# Patient Record
Sex: Female | Born: 1975 | Hispanic: Yes | Marital: Married | Smoking: Never smoker
Health system: Southern US, Community
[De-identification: ages and names within clinical notes are randomized; demographics above are authoritative.]

## PROBLEM LIST (undated history)

## (undated) ENCOUNTER — Inpatient Hospital Stay (HOSPITAL_COMMUNITY): Payer: Self-pay

## (undated) DIAGNOSIS — O24419 Gestational diabetes mellitus in pregnancy, unspecified control: Secondary | ICD-10-CM

## (undated) DIAGNOSIS — I358 Other nonrheumatic aortic valve disorders: Secondary | ICD-10-CM

## (undated) DIAGNOSIS — R748 Abnormal levels of other serum enzymes: Secondary | ICD-10-CM

## (undated) HISTORY — DX: Abnormal levels of other serum enzymes: R74.8

## (undated) HISTORY — PX: CHOLECYSTECTOMY: SHX55

## (undated) HISTORY — DX: Other nonrheumatic aortic valve disorders: I35.8

## (undated) HISTORY — DX: Gestational diabetes mellitus in pregnancy, unspecified control: O24.419

---

## 1997-10-22 ENCOUNTER — Inpatient Hospital Stay (HOSPITAL_COMMUNITY): Admission: AD | Admit: 1997-10-22 | Discharge: 1997-10-22 | Payer: Self-pay | Admitting: Obstetrics & Gynecology

## 1998-03-28 ENCOUNTER — Inpatient Hospital Stay (HOSPITAL_COMMUNITY): Admission: AD | Admit: 1998-03-28 | Discharge: 1998-03-28 | Payer: Self-pay | Admitting: Obstetrics

## 1998-03-29 ENCOUNTER — Emergency Department (HOSPITAL_COMMUNITY): Admission: EM | Admit: 1998-03-29 | Discharge: 1998-03-29 | Payer: Self-pay | Admitting: Emergency Medicine

## 1999-09-11 ENCOUNTER — Emergency Department (HOSPITAL_COMMUNITY): Admission: EM | Admit: 1999-09-11 | Discharge: 1999-09-11 | Payer: Self-pay | Admitting: Emergency Medicine

## 1999-09-11 ENCOUNTER — Encounter: Payer: Self-pay | Admitting: Emergency Medicine

## 1999-11-22 ENCOUNTER — Emergency Department (HOSPITAL_COMMUNITY): Admission: EM | Admit: 1999-11-22 | Discharge: 1999-11-22 | Payer: Self-pay | Admitting: Emergency Medicine

## 2000-03-03 ENCOUNTER — Encounter: Payer: Self-pay | Admitting: Emergency Medicine

## 2000-03-03 ENCOUNTER — Emergency Department (HOSPITAL_COMMUNITY): Admission: EM | Admit: 2000-03-03 | Discharge: 2000-03-03 | Payer: Self-pay | Admitting: Emergency Medicine

## 2000-05-03 ENCOUNTER — Emergency Department (HOSPITAL_COMMUNITY): Admission: EM | Admit: 2000-05-03 | Discharge: 2000-05-03 | Payer: Self-pay | Admitting: Emergency Medicine

## 2000-06-22 ENCOUNTER — Emergency Department (HOSPITAL_COMMUNITY): Admission: EM | Admit: 2000-06-22 | Discharge: 2000-06-23 | Payer: Self-pay | Admitting: Emergency Medicine

## 2000-06-22 ENCOUNTER — Encounter: Payer: Self-pay | Admitting: Emergency Medicine

## 2000-09-07 ENCOUNTER — Emergency Department (HOSPITAL_COMMUNITY): Admission: EM | Admit: 2000-09-07 | Discharge: 2000-09-07 | Payer: Self-pay | Admitting: Emergency Medicine

## 2001-01-14 ENCOUNTER — Inpatient Hospital Stay (HOSPITAL_COMMUNITY): Admission: AD | Admit: 2001-01-14 | Discharge: 2001-01-14 | Payer: Self-pay | Admitting: Obstetrics

## 2001-03-19 ENCOUNTER — Emergency Department (HOSPITAL_COMMUNITY): Admission: EM | Admit: 2001-03-19 | Discharge: 2001-03-20 | Payer: Self-pay | Admitting: Emergency Medicine

## 2001-05-27 ENCOUNTER — Encounter: Payer: Self-pay | Admitting: Emergency Medicine

## 2001-05-27 ENCOUNTER — Emergency Department (HOSPITAL_COMMUNITY): Admission: EM | Admit: 2001-05-27 | Discharge: 2001-05-27 | Payer: Self-pay | Admitting: Emergency Medicine

## 2001-09-11 ENCOUNTER — Encounter: Payer: Self-pay | Admitting: Emergency Medicine

## 2001-09-11 ENCOUNTER — Emergency Department (HOSPITAL_COMMUNITY): Admission: EM | Admit: 2001-09-11 | Discharge: 2001-09-11 | Payer: Self-pay | Admitting: Emergency Medicine

## 2003-09-06 ENCOUNTER — Inpatient Hospital Stay (HOSPITAL_COMMUNITY): Admission: AD | Admit: 2003-09-06 | Discharge: 2003-09-07 | Payer: Self-pay | Admitting: Obstetrics and Gynecology

## 2003-09-07 ENCOUNTER — Ambulatory Visit (HOSPITAL_COMMUNITY): Admission: RE | Admit: 2003-09-07 | Discharge: 2003-09-07 | Payer: Self-pay | Admitting: Obstetrics & Gynecology

## 2003-09-10 ENCOUNTER — Emergency Department (HOSPITAL_COMMUNITY): Admission: EM | Admit: 2003-09-10 | Discharge: 2003-09-10 | Payer: Self-pay | Admitting: Emergency Medicine

## 2003-09-14 ENCOUNTER — Emergency Department (HOSPITAL_COMMUNITY): Admission: EM | Admit: 2003-09-14 | Discharge: 2003-09-14 | Payer: Self-pay | Admitting: Emergency Medicine

## 2004-05-25 ENCOUNTER — Inpatient Hospital Stay (HOSPITAL_COMMUNITY): Admission: AD | Admit: 2004-05-25 | Discharge: 2004-05-25 | Payer: Self-pay | Admitting: *Deleted

## 2004-09-21 ENCOUNTER — Inpatient Hospital Stay (HOSPITAL_COMMUNITY): Admission: AD | Admit: 2004-09-21 | Discharge: 2004-09-21 | Payer: Self-pay | Admitting: Obstetrics & Gynecology

## 2007-06-18 ENCOUNTER — Inpatient Hospital Stay (HOSPITAL_COMMUNITY): Admission: AD | Admit: 2007-06-18 | Discharge: 2007-06-19 | Payer: Self-pay | Admitting: Obstetrics and Gynecology

## 2007-06-18 ENCOUNTER — Ambulatory Visit: Payer: Self-pay | Admitting: *Deleted

## 2007-06-19 ENCOUNTER — Ambulatory Visit (HOSPITAL_COMMUNITY): Admission: RE | Admit: 2007-06-19 | Discharge: 2007-06-19 | Payer: Self-pay | Admitting: Family Medicine

## 2009-06-21 ENCOUNTER — Emergency Department (HOSPITAL_COMMUNITY): Admission: EM | Admit: 2009-06-21 | Discharge: 2009-06-21 | Payer: Self-pay | Admitting: Emergency Medicine

## 2010-08-30 ENCOUNTER — Inpatient Hospital Stay (HOSPITAL_COMMUNITY)
Admission: EM | Admit: 2010-08-30 | Discharge: 2010-08-31 | Payer: Self-pay | Source: Home / Self Care | Attending: Internal Medicine | Admitting: Internal Medicine

## 2010-08-30 ENCOUNTER — Encounter (INDEPENDENT_AMBULATORY_CARE_PROVIDER_SITE_OTHER): Payer: Self-pay | Admitting: Internal Medicine

## 2010-11-14 LAB — BASIC METABOLIC PANEL
BUN: 11 mg/dL (ref 6–23)
CO2: 23 mEq/L (ref 19–32)
Calcium: 8.7 mg/dL (ref 8.4–10.5)
Chloride: 111 mEq/L (ref 96–112)
Creatinine, Ser: 0.79 mg/dL (ref 0.4–1.2)
GFR calc Af Amer: >60 mL/min (ref >60)
GFR calc non Af Amer: >60 mL/min (ref >60)
Glucose, Bld: 109 mg/dL — ABNORMAL HIGH (ref 70–99)
Potassium: 3.7 mEq/L (ref 3.5–5.1)
Sodium: 141 mEq/L (ref 135–145)

## 2010-11-14 LAB — CBC
HCT: 37.7 % (ref 36.0–46.0)
HCT: 37.7 % (ref 36.0–46.0)
Hemoglobin: 13 g/dL (ref 12.0–15.0)
Hemoglobin: 13.2 g/dL (ref 12.0–15.0)
MCH: 31.1 pg (ref 26.0–34.0)
MCH: 31.3 pg (ref 26.0–34.0)
MCHC: 34.5 g/dL (ref 30.0–36.0)
MCHC: 35 g/dL (ref 30.0–36.0)
MCV: 88.9 fL (ref 78.0–100.0)
MCV: 90.6 fL (ref 78.0–100.0)
Platelets: 214 10*3/uL (ref 150–400)
Platelets: 235 10*3/uL (ref 150–400)
RBC: 4.16 MIL/uL (ref 3.87–5.11)
RBC: 4.24 MIL/uL (ref 3.87–5.11)
RDW: 12.3 % (ref 11.5–15.5)
RDW: 12.5 % (ref 11.5–15.5)
WBC: 5.3 10*3/uL (ref 4.0–10.5)
WBC: 5.9 10*3/uL (ref 4.0–10.5)

## 2010-11-14 LAB — COMPREHENSIVE METABOLIC PANEL
ALT: 20 U/L (ref 0–35)
AST: 24 U/L (ref 0–37)
Albumin: 3.8 g/dL (ref 3.5–5.2)
Alkaline Phosphatase: 49 U/L (ref 39–117)
BUN: 8 mg/dL (ref 6–23)
CO2: 24 mEq/L (ref 19–32)
Calcium: 8.8 mg/dL (ref 8.4–10.5)
Chloride: 110 mEq/L (ref 96–112)
Creatinine, Ser: 0.68 mg/dL (ref 0.4–1.2)
GFR calc Af Amer: >60 mL/min (ref >60)
GFR calc non Af Amer: >60 mL/min (ref >60)
Glucose, Bld: 101 mg/dL — ABNORMAL HIGH (ref 70–99)
Potassium: 3.2 mEq/L — ABNORMAL LOW (ref 3.5–5.1)
Sodium: 141 mEq/L (ref 135–145)
Total Bilirubin: 1.6 mg/dL — ABNORMAL HIGH (ref 0.3–1.2)
Total Protein: 6.7 g/dL (ref 6.0–8.3)

## 2010-11-14 LAB — RAPID URINE DRUG SCREEN, HOSP PERFORMED
Amphetamines: NOT DETECTED
Barbiturates: NOT DETECTED
Benzodiazepines: NOT DETECTED
Cocaine: NOT DETECTED
Opiates: NOT DETECTED
Tetrahydrocannabinol: NOT DETECTED

## 2010-11-14 LAB — LIPID PANEL
Cholesterol: 115 mg/dL (ref 0–200)
HDL: 40 mg/dL (ref >39)
LDL Cholesterol: 62 mg/dL (ref 0–99)
Total CHOL/HDL Ratio: 2.9 RATIO
Triglycerides: 65 mg/dL (ref <150)
VLDL: 13 mg/dL (ref 0–40)

## 2010-11-14 LAB — GLUCOSE, CAPILLARY: Glucose-Capillary: 114 mg/dL — ABNORMAL HIGH (ref 70–99)

## 2010-11-14 LAB — URINALYSIS, ROUTINE W REFLEX MICROSCOPIC
Bilirubin Urine: NEGATIVE
Glucose, UA: NEGATIVE mg/dL
Ketones, ur: NEGATIVE mg/dL
Leukocytes, UA: NEGATIVE
Nitrite: NEGATIVE
Protein, ur: NEGATIVE mg/dL
Specific Gravity, Urine: 1.006 (ref 1.005–1.030)
Urobilinogen, UA: 0.2 mg/dL (ref 0.0–1.0)
pH: 7 (ref 5.0–8.0)

## 2010-11-14 LAB — DIFFERENTIAL
Basophils Absolute: 0 10*3/uL (ref 0.0–0.1)
Basophils Relative: 1 % (ref 0–1)
Eosinophils Absolute: 0.2 10*3/uL (ref 0.0–0.7)
Eosinophils Relative: 3 % (ref 0–5)
Lymphocytes Relative: 33 % (ref 12–46)
Lymphs Abs: 2 10*3/uL (ref 0.7–4.0)
Monocytes Absolute: 0.5 10*3/uL (ref 0.1–1.0)
Monocytes Relative: 8 % (ref 3–12)
Neutro Abs: 3.2 10*3/uL (ref 1.7–7.7)
Neutrophils Relative %: 55 % (ref 43–77)

## 2010-11-14 LAB — URINE MICROSCOPIC-ADD ON

## 2010-11-14 LAB — CARDIAC PANEL(CRET KIN+CKTOT+MB+TROPI)
CK, MB: 1 ng/mL (ref 0.3–4.0)
Relative Index: 0.9 (ref 0.0–2.5)
Total CK: 107 U/L (ref 7–177)
Troponin I: 0.01 ng/mL (ref 0.00–0.06)

## 2010-11-14 LAB — D-DIMER, QUANTITATIVE
D-Dimer, Quant: 0.22 ug/mL-FEU (ref 0.00–0.48)
D-Dimer, Quant: 0.29 ug/mL-FEU (ref 0.00–0.48)

## 2010-11-14 LAB — POCT PREGNANCY, URINE: Preg Test, Ur: NEGATIVE

## 2010-11-14 LAB — HEMOGLOBIN A1C
Hgb A1c MFr Bld: 5.9 % — ABNORMAL HIGH (ref <5.7)
Mean Plasma Glucose: 123 mg/dL — ABNORMAL HIGH (ref <117)

## 2010-11-14 LAB — TSH: TSH: 1.138 u[IU]/mL (ref 0.350–4.500)

## 2010-11-14 LAB — POCT CARDIAC MARKERS
CKMB, poc: <1 ng/mL — ABNORMAL LOW (ref 1.0–8.0)
Myoglobin, poc: 38.9 ng/mL (ref 12–200)
Troponin i, poc: <0.05 ng/mL (ref 0.00–0.09)

## 2010-12-08 LAB — CBC
Platelets: 198 10*3/uL (ref 150–400)
RDW: 12.4 % (ref 11.5–15.5)
WBC: 6.9 10*3/uL (ref 4.0–10.5)

## 2010-12-08 LAB — POCT I-STAT, CHEM 8
BUN: 14 mg/dL (ref 6–23)
Chloride: 106 mEq/L (ref 96–112)
HCT: 42 % (ref 36.0–46.0)
Sodium: 141 mEq/L (ref 135–145)
TCO2: 25 mmol/L (ref 0–100)

## 2010-12-08 LAB — DIFFERENTIAL
Basophils Absolute: 0 10*3/uL (ref 0.0–0.1)
Eosinophils Absolute: 0.3 10*3/uL (ref 0.0–0.7)
Lymphocytes Relative: 28 % (ref 12–46)
Lymphs Abs: 2 10*3/uL (ref 0.7–4.0)
Neutrophils Relative %: 63 % (ref 43–77)

## 2010-12-08 LAB — D-DIMER, QUANTITATIVE: D-Dimer, Quant: <0.22 ug/mL-FEU (ref 0.00–0.48)

## 2010-12-08 LAB — URINE MICROSCOPIC-ADD ON

## 2010-12-08 LAB — POCT PREGNANCY, URINE: Preg Test, Ur: NEGATIVE

## 2010-12-08 LAB — URINALYSIS, ROUTINE W REFLEX MICROSCOPIC
Bilirubin Urine: NEGATIVE
Glucose, UA: NEGATIVE mg/dL
Hgb urine dipstick: NEGATIVE
Specific Gravity, Urine: 1.019 (ref 1.005–1.030)
pH: 5.5 (ref 5.0–8.0)

## 2010-12-08 LAB — LIPASE, BLOOD: Lipase: 38 U/L (ref 11–59)

## 2011-03-01 ENCOUNTER — Emergency Department (HOSPITAL_COMMUNITY)
Admission: EM | Admit: 2011-03-01 | Discharge: 2011-03-01 | Disposition: A | Discharge status: Home / Self Care | Payer: Self-pay | Attending: Emergency Medicine | Admitting: Emergency Medicine

## 2011-03-01 DIAGNOSIS — S61209A Unspecified open wound of unspecified finger without damage to nail, initial encounter: Secondary | ICD-10-CM | POA: Insufficient documentation

## 2011-03-01 DIAGNOSIS — R209 Unspecified disturbances of skin sensation: Secondary | ICD-10-CM | POA: Insufficient documentation

## 2011-03-01 DIAGNOSIS — W260XXA Contact with knife, initial encounter: Secondary | ICD-10-CM | POA: Insufficient documentation

## 2011-03-01 DIAGNOSIS — Z23 Encounter for immunization: Secondary | ICD-10-CM | POA: Insufficient documentation

## 2011-06-14 LAB — STREP B DNA PROBE

## 2011-06-15 LAB — URINALYSIS, ROUTINE W REFLEX MICROSCOPIC
Glucose, UA: NEGATIVE
Hgb urine dipstick: NEGATIVE
Protein, ur: NEGATIVE
pH: 6.5

## 2012-08-18 ENCOUNTER — Encounter (HOSPITAL_COMMUNITY): Payer: Self-pay | Admitting: Emergency Medicine

## 2012-08-18 ENCOUNTER — Observation Stay (HOSPITAL_COMMUNITY)
Admission: EM | Admit: 2012-08-18 | Discharge: 2012-08-20 | Disposition: A | Discharge status: Home / Self Care | Payer: Self-pay | Attending: Internal Medicine | Admitting: Internal Medicine

## 2012-08-18 ENCOUNTER — Emergency Department (HOSPITAL_COMMUNITY): Payer: Self-pay

## 2012-08-18 DIAGNOSIS — R112 Nausea with vomiting, unspecified: Secondary | ICD-10-CM

## 2012-08-18 DIAGNOSIS — A09 Infectious gastroenteritis and colitis, unspecified: Principal | ICD-10-CM

## 2012-08-18 DIAGNOSIS — R519 Headache, unspecified: Secondary | ICD-10-CM | POA: Diagnosis present

## 2012-08-18 DIAGNOSIS — R51 Headache: Secondary | ICD-10-CM | POA: Insufficient documentation

## 2012-08-18 DIAGNOSIS — K529 Noninfective gastroenteritis and colitis, unspecified: Secondary | ICD-10-CM | POA: Diagnosis present

## 2012-08-18 DIAGNOSIS — R7402 Elevation of levels of lactic acid dehydrogenase (LDH): Secondary | ICD-10-CM | POA: Insufficient documentation

## 2012-08-18 DIAGNOSIS — R109 Unspecified abdominal pain: Secondary | ICD-10-CM | POA: Insufficient documentation

## 2012-08-18 DIAGNOSIS — I358 Other nonrheumatic aortic valve disorders: Secondary | ICD-10-CM | POA: Diagnosis present

## 2012-08-18 DIAGNOSIS — R748 Abnormal levels of other serum enzymes: Secondary | ICD-10-CM | POA: Diagnosis present

## 2012-08-18 DIAGNOSIS — R7401 Elevation of levels of liver transaminase levels: Secondary | ICD-10-CM | POA: Insufficient documentation

## 2012-08-18 DIAGNOSIS — R55 Syncope and collapse: Secondary | ICD-10-CM | POA: Insufficient documentation

## 2012-08-18 DIAGNOSIS — K56600 Partial intestinal obstruction, unspecified as to cause: Secondary | ICD-10-CM | POA: Diagnosis present

## 2012-08-18 DIAGNOSIS — I359 Nonrheumatic aortic valve disorder, unspecified: Secondary | ICD-10-CM | POA: Insufficient documentation

## 2012-08-18 LAB — LIPASE, BLOOD: Lipase: 36 U/L (ref 11–59)

## 2012-08-18 LAB — CBC WITH DIFFERENTIAL/PLATELET
Basophils Relative: 0 % (ref 0–1)
Eosinophils Absolute: 0.2 10*3/uL (ref 0.0–0.7)
Eosinophils Relative: 3 % (ref 0–5)
Lymphs Abs: 0.9 10*3/uL (ref 0.7–4.0)
MCH: 30.3 pg (ref 26.0–34.0)
MCHC: 34.3 g/dL (ref 30.0–36.0)
MCV: 88.4 fL (ref 78.0–100.0)
Neutrophils Relative %: 77 % (ref 43–77)
Platelets: 156 10*3/uL (ref 150–400)

## 2012-08-18 LAB — COMPREHENSIVE METABOLIC PANEL
ALT: 88 U/L — ABNORMAL HIGH (ref 0–35)
AST: 138 U/L — ABNORMAL HIGH (ref 0–37)
Albumin: 3.2 g/dL — ABNORMAL LOW (ref 3.5–5.2)
Alkaline Phosphatase: 58 U/L (ref 39–117)
CO2: 24 mEq/L (ref 19–32)
Chloride: 106 mEq/L (ref 96–112)
GFR calc non Af Amer: >90 mL/min (ref >90)
Potassium: 3.4 mEq/L — ABNORMAL LOW (ref 3.5–5.1)
Sodium: 137 mEq/L (ref 135–145)
Total Bilirubin: 1.1 mg/dL (ref 0.3–1.2)

## 2012-08-18 LAB — URINALYSIS, ROUTINE W REFLEX MICROSCOPIC
Glucose, UA: NEGATIVE mg/dL
Hgb urine dipstick: NEGATIVE
Ketones, ur: NEGATIVE mg/dL
Protein, ur: NEGATIVE mg/dL
Urobilinogen, UA: 1 mg/dL (ref 0.0–1.0)

## 2012-08-18 LAB — URINE MICROSCOPIC-ADD ON

## 2012-08-18 LAB — PREGNANCY, URINE: Preg Test, Ur: NEGATIVE

## 2012-08-18 MED ORDER — KETOROLAC TROMETHAMINE 30 MG/ML IJ SOLN
30.0000 mg | Freq: Four times a day (QID) | INTRAMUSCULAR | Status: DC | PRN
  Administered 2012-08-18 (×2): 30 mg via INTRAVENOUS
  Filled 2012-08-18 (×2): qty 1

## 2012-08-18 MED ORDER — HYDROMORPHONE HCL PF 1 MG/ML IJ SOLN
1.0000 mg | Freq: Once | INTRAMUSCULAR | Status: AC
  Administered 2012-08-18: 1 mg via INTRAVENOUS
  Filled 2012-08-18: qty 1

## 2012-08-18 MED ORDER — POTASSIUM CHLORIDE 10 MEQ/100ML IV SOLN
10.0000 meq | INTRAVENOUS | Status: AC
  Administered 2012-08-18 (×4): 10 meq via INTRAVENOUS
  Filled 2012-08-18 (×4): qty 100

## 2012-08-18 MED ORDER — SODIUM CHLORIDE 0.9 % IV SOLN
INTRAVENOUS | Status: DC
  Administered 2012-08-18 – 2012-08-19 (×3): via INTRAVENOUS
  Administered 2012-08-19: 100 mL/h via INTRAVENOUS
  Administered 2012-08-20: 09:00:00 via INTRAVENOUS

## 2012-08-18 MED ORDER — ENOXAPARIN SODIUM 40 MG/0.4ML ~~LOC~~ SOLN
40.0000 mg | Freq: Every day | SUBCUTANEOUS | Status: DC
  Administered 2012-08-18 – 2012-08-20 (×3): 40 mg via SUBCUTANEOUS
  Filled 2012-08-18 (×3): qty 0.4

## 2012-08-18 MED ORDER — SODIUM CHLORIDE 0.9 % IV SOLN
Freq: Once | INTRAVENOUS | Status: AC
  Administered 2012-08-18: 04:00:00 via INTRAVENOUS

## 2012-08-18 MED ORDER — HYDROMORPHONE HCL PF 1 MG/ML IJ SOLN
0.5000 mg | Freq: Once | INTRAMUSCULAR | Status: AC
  Administered 2012-08-18: 0.5 mg via INTRAVENOUS
  Filled 2012-08-18: qty 1

## 2012-08-18 MED ORDER — PHENOL 1.4 % MT LIQD
1.0000 | OROMUCOSAL | Status: DC | PRN
  Filled 2012-08-18: qty 177

## 2012-08-18 MED ORDER — ACETAMINOPHEN 325 MG PO TABS
650.0000 mg | ORAL_TABLET | Freq: Four times a day (QID) | ORAL | Status: DC | PRN
  Administered 2012-08-19: 650 mg via ORAL
  Filled 2012-08-18 (×2): qty 2

## 2012-08-18 MED ORDER — ONDANSETRON HCL 4 MG/2ML IJ SOLN
4.0000 mg | Freq: Once | INTRAMUSCULAR | Status: AC
  Administered 2012-08-18: 4 mg via INTRAVENOUS
  Filled 2012-08-18: qty 2

## 2012-08-18 MED ORDER — ONDANSETRON HCL 4 MG/2ML IJ SOLN
4.0000 mg | Freq: Four times a day (QID) | INTRAMUSCULAR | Status: DC | PRN
  Administered 2012-08-18 – 2012-08-20 (×2): 4 mg via INTRAVENOUS
  Filled 2012-08-18 (×2): qty 2

## 2012-08-18 MED ORDER — SALINE SPRAY 0.65 % NA SOLN
1.0000 | NASAL | Status: DC | PRN
  Filled 2012-08-18: qty 44

## 2012-08-18 MED ORDER — METOCLOPRAMIDE HCL 5 MG/ML IJ SOLN
10.0000 mg | Freq: Once | INTRAMUSCULAR | Status: AC
  Administered 2012-08-18: 10 mg via INTRAVENOUS
  Filled 2012-08-18: qty 2

## 2012-08-18 MED ORDER — PANTOPRAZOLE SODIUM 40 MG IV SOLR
40.0000 mg | Freq: Every day | INTRAVENOUS | Status: DC
  Administered 2012-08-18 – 2012-08-19 (×2): 40 mg via INTRAVENOUS
  Filled 2012-08-18 (×3): qty 40

## 2012-08-18 MED ORDER — ACETAMINOPHEN 650 MG RE SUPP: 650.0000 mg | Freq: Four times a day (QID) | RECTAL | Status: DC | PRN

## 2012-08-18 MED ORDER — IOHEXOL 300 MG/ML  SOLN
100.0000 mL | Freq: Once | INTRAMUSCULAR | Status: AC | PRN
  Administered 2012-08-18: 100 mL via INTRAVENOUS

## 2012-08-18 MED ORDER — LIDOCAINE VISCOUS 2 % MT SOLN
20.0000 mL | Freq: Once | OROMUCOSAL | Status: AC
  Administered 2012-08-18: 20 mL via OROMUCOSAL
  Filled 2012-08-18: qty 15

## 2012-08-18 MED ORDER — IOHEXOL 300 MG/ML  SOLN
20.0000 mL | INTRAMUSCULAR | Status: AC
  Administered 2012-08-18 (×2): 20 mL via ORAL

## 2012-08-18 NOTE — ED Notes (Signed)
Pt finished contrast. CT paged.

## 2012-08-18 NOTE — ED Notes (Signed)
MD at bedside. 

## 2012-08-18 NOTE — Progress Notes (Signed)
[id number] To 6700 per stretcher alert and oriented patient accompanoed by RN; no skin issues noted speaks a little bit of English; oriented to unit set up; informed that she is in a camera room and she is agreeable to keep the camera open; call light within reach;

## 2012-08-18 NOTE — ED Provider Notes (Signed)
History     CSN: [id number]  Arrival date & time 08/18/12  0309   First MD Initiated Contact with Patient 08/18/12 [phone]      Chief Complaint  Patient presents with  . Nausea    (Consider location/radiation/quality/duration/timing/severity/associated sxs/prior treatment) HPI Comments: 36 year old female presents with a history of having a cholecystectomy laparoscopically in the past and has developed nausea over 24 hours followed by supraumbilical abdominal pain. She states the pain was acute in onset approximately 10 minutes before the paramedics arrived, it has been persistent, worse with palpation, worse with doing a sit up and associated with nausea. She did have an associated syncopal episode prior to arrival. She denies any vomiting, she does agree that she has had some loose brown stools. At this time the pain is 10 out of 10.  The history is provided by the patient and a relative.    Past Medical History  Diagnosis Date  . No pertinent past medical history     Past Surgical History  Procedure Date  . Cholecystectomy     No family history on file.  History  Substance Use Topics  . Smoking status: Never Smoker   . Smokeless tobacco: Not on file  . Alcohol Use: No    OB History    Grav Para Term Preterm Abortions TAB SAB Ect Mult Living                  Review of Systems  All other systems reviewed and are negative.    Allergies  Ciprofloxacin  Home Medications  No current outpatient prescriptions on file.  BP 131/82  Pulse 74  Temp 98.7 F (37.1 C) (Oral)  Resp 16  Wt 152 lb 12.5 oz (69.3 kg)  SpO2 99%  LMP 08/11/2012  Physical Exam  Nursing note and vitals reviewed. Constitutional: She appears well-developed and well-nourished. No distress.  HENT:  Head: Normocephalic and atraumatic.  Mouth/Throat: Oropharynx is clear and moist. No oropharyngeal exudate.  Eyes: Conjunctivae normal and EOM are normal. Pupils are equal, round, and reactive  to light. Right eye exhibits no discharge. Left eye exhibits no discharge. No scleral icterus.  Neck: Normal range of motion. Neck supple. No JVD present. No thyromegaly present.  Cardiovascular: Normal rate, regular rhythm, normal heart sounds and intact distal pulses.  Exam reveals no gallop and no friction rub.   No murmur heard. Pulmonary/Chest: Effort normal and breath sounds normal. No respiratory distress. She has no wheezes. She has no rales.  Abdominal: Soft. Bowel sounds are normal. She exhibits no distension and no mass. There is tenderness.       Supraumbilical tenderness with palpation of a soft fullness with tympanitic sounds to percussion in this area. There is no tenderness in the lower abdomen including at McBurney's point, no guarding, no peritoneal signs.  Musculoskeletal: Normal range of motion. She exhibits no edema and no tenderness.  Lymphadenopathy:    She has no cervical adenopathy.  Neurological: She is alert. Coordination normal.  Skin: Skin is warm and dry. No rash noted. No erythema.  Psychiatric: She has a normal mood and affect. Her behavior is normal.    ED Course  Procedures (including critical care time)  Labs Reviewed  COMPREHENSIVE METABOLIC PANEL - Abnormal; Notable for the following:    Potassium 3.4 (*)     Glucose, Bld 122 (*)     Calcium 7.9 (*)     Total Protein 5.8 (*)     Albumin 3.2 (*)  AST 138 (*)     ALT 88 (*)     All other components within normal limits  CBC WITH DIFFERENTIAL - Abnormal; Notable for the following:    HCT 35.0 (*)     All other components within normal limits  URINALYSIS, ROUTINE W REFLEX MICROSCOPIC - Abnormal; Notable for the following:    APPearance CLOUDY (*)     Leukocytes, UA SMALL (*)     All other components within normal limits  URINE MICROSCOPIC-ADD ON - Abnormal; Notable for the following:    Squamous Epithelial / LPF FEW (*)     Bacteria, UA MANY (*)     All other components within normal limits   HEMOGLOBIN A1C - Abnormal; Notable for the following:    Hemoglobin A1C 5.8 (*)     Mean Plasma Glucose 120 (*)     All other components within normal limits  LIPASE, BLOOD  PREGNANCY, URINE  TROPONIN I  URINE CULTURE  BASIC METABOLIC PANEL  CBC  PROTIME-INR  HEPATIC FUNCTION PANEL   Ct Abdomen Pelvis W Contrast  08/18/2012  *RADIOLOGY REPORT*  Clinical Data: Nausea yesterday.  Syncope.  Epigastric abdominal pain.  Supraumbilical mass.  CT ABDOMEN AND PELVIS WITH CONTRAST  Technique:  Multidetector CT imaging of the abdomen and pelvis was performed following the standard protocol during bolus administration of intravenous contrast.  Contrast: OMNIPAQUE IOHEXOL 300 MG/ML  SOLN  Comparison: None.  Findings: Atelectasis in the lung bases.  Surgical absence of the gallbladder.  Mild intrahepatic bile duct dilatation.  This may be due to postoperative physiology.  No focal liver lesions.  The spleen, pancreas, adrenal glands, kidneys, abdominal aorta, and retroperitoneal lymph nodes are unremarkable. The stomach is somewhat distended but contrast material passes freely into the small bowel suggesting no evidence of obstruction. The small bowel and colon are not abnormally distended.  Stool filled colon.  No free air or free fluid in the abdomen.  Pelvis:  The uterus is not enlarged.  Probable cysts in the right ovary.  Left ovary is not enlarged.  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  No free or loculated pelvic fluid collections.  The bladder wall is not thickened.  Appendix is normal.  No diverticulitis.  The abdominal wall musculature appears intact.  No hernia is demonstrated.  No abdominal mass lesions. Normal alignment of the lumbar vertebrae.  IMPRESSION: Postoperative cholecystectomy.  Mild bile duct dilatation may represent postoperative physiology.  Pelvic venous varicosities suggesting pelvic congestion.  Probable right ovarian cyst.  No evidence of hernia or  mass.  Mild gastric distension.   Original Report Authenticated By: Burman Nieves, M.D.      1. Abdominal pain   2. Nausea and vomiting   3. Transaminitis       MDM  Overall the patient is well-appearing, she has a normal cardiac and pulmonary exam, she is not tachycardic and does not have a surgical abdomen at this time. She does have tympanic sounds to percussion with a soft mass in the upper abdomen which could be consistent with a post surgical ventral wall hernia. CT scan ordered, labs, medications, fluids.  ECG  ED ECG REPORT  I personally interpreted this EKG   Date: 08/18/2012   Rate: 69  Rhythm: normal sinus rhythm  QRS Axis: normal  Intervals: normal  ST/T Wave abnormalities: normal  Conduction Disutrbances:none  Narrative Interpretation:   Old EKG Reviewed: none available  Overall the patient's workup has not shown a  definitive source for her pain. Her CT scan shows a significantly expanded stomach but no signs of gastric outlet obstruction, or transaminitis is unexplained based on her workup. Because of her ongoing nausea vomiting and abdominal pain she will be admitted. I have requested that an NG tube be placed to decompress the patient's stomach in hopes of symptomatic relief       Vida Roller, MD 08/18/12 2256

## 2012-08-18 NOTE — ED Notes (Signed)
Pt family informed that when patient finishes oral contrast, then the CT scan will be performed.

## 2012-08-18 NOTE — H&P (Signed)
Hospital Admission Note Date: 08/18/2012  Patient name: Misty Gamble Medical record number: [id number] Date of birth: 06-13-1976 Age: 36 y.o. Gender: female PCP: No primary provider on file.  Medical Service: Internal medicine teaching service   Attending physician: Dr. Aundria Rud   1st Contact:   Pager: [phone]  2nd Contact:   Pager: [phone]    After 5 pm or weekends:  1st Contact: Pager: [phone]  2nd Contact: Pager: [phone]   Chief Complaint: Abdominal pain, and vomiting for one day.  History of Present Illness: The patient is a 36 year old, pleasant woman, Spanish speaking, who presented to the ED with history of abdominal pain for 8 hours. The pain was on gradual onset and she described it sharp, mainly located in the mid epigastrium and radiating to the back. She also reported that the abdomen felt like full of gas. The pain was intermittent and progressively worsening in 1 hour intervals. The pain was severe to a scale of 9/10 and it reduces to about a 5/10 between episodes. There was associated history of non-bilious, non-blood vomiting, and this has happened about 3-4 times since 2 AM on the day of admission. The vomitus is mainly clear fluids. She had had a bowel movement on the night of admission. She had been feeling unwell for about 2-3 days before presentation to the ED with malaise and some dizziness. However, she denies history of fever or chills. On the night of presentation to the ED, she required the support of her sign because of dizziness. She denies history of melena, or bloody stools. After she had an NG tube passed, she felt her abdomen was getting deflated and the symptoms were gradually improving. Of note, her 36-year-old son had similar symptoms for about the last 1 week with diarrhea, nausea and vomiting. He was at home, still sick and had not received treatment.  In 2004, she had abdominal pain and swelling and she presented to a hospital in [place], [place]  where lapascopic abdominal surgery was perfomed. There was no records about this operation. She reports that during that surgery she suffered a cardiac arrest, but she does not remember much of the details. In 2006, she had an episode of cholecystitis and she had laparoscopic cholecystectomy. She does not have any other history of abdominal surgeries.  The patient is a single mother and lives in an apartment with her 5 children in [place]. She reports that she has had some breakdowns of her water/sewage system in the house for the last several days, and the landlord is hesitant to have it fixed. She works as a Advertising copywriter. She does not have a primary care physician and, she does not have health insurance.  Her only medications include Motrin, which she took 2 tablets for headache, about 2 days ago. She denies taking a lot of this medication. She is also taking multivitamins and calcium supplements. She does not take any other regular medications.   Meds: Current Outpatient Rx  Name  Route  Sig  Dispense  Refill  . ASPIRIN EFFERVESCENT 325 MG PO TBEF   Oral   Take 325 mg by mouth every 6 (six) hours as needed. For upset stomach         . ADULT MULTIVITAMIN W/MINERALS CH   Oral   Take 1 tablet by mouth daily.           Allergies: Allergies as of 08/18/2012 - Review Complete 08/18/2012  Allergen Reaction Noted  . Ciprofloxacin Rash 08/18/2012  No past medical history on file. Past Surgical History  Procedure Date  . Cholecystectomy    No family history on file. History   Social History  . Marital Status: Legally Separated    Spouse Name: N/A    Number of Children: N/A  . Years of Education: N/A   Occupational History  . Not on file.   Social History Main Topics  . Smoking status: Not on file  . Smokeless tobacco: Not on file  . Alcohol Use:   . Drug Use:   . Sexually Active:    Other Topics Concern  . Not on file   Social History Narrative  . No narrative on  file    Review of Systems: Eyes: negative Respiratory: negative for cough, dyspnea on exertion, emphysema, hemoptysis, pneumonia, sputum and wheezing Cardiovascular: positive for undiagnosed heart murmur, negative for chest pressure/discomfort, dyspnea, irregular heart beat, near-syncope, orthopnea, palpitations, paroxysmal nocturnal dyspnea and syncope Genitourinary:negative for dysuria, frequency, hematuria, hesitancy, nocturia and urinary incontinence Musculoskeletal:negative Neurological: negative for coordination problems, gait problems, memory problems, paresthesia, tremors and vertigo Endocrine: negative Allergic/Immunologic: negative  Physical Exam: Blood pressure 107/65, pulse 89, temperature 98.8 F (37.1 C), temperature source Oral, resp. rate 10, last menstrual period 08/11/2012, SpO2 95.00%. General appearance: alert, cooperative, appears stated age, fatigued, mild distress and in mild pain with NG tube in situ Head: Normocephalic, without obvious abnormality, atraumatic Eyes: conjunctivae/corneas clear. PERRL, EOM's intact. Fundi benign. Throat: lips, mucosa, and tongue normal; teeth and gums normal Neck: no adenopathy, no carotid bruit, no JVD, supple, symmetrical, trachea midline and thyroid not enlarged, symmetric, no tenderness/mass/nodules Back: no tenderness to percussion or palpation, she points to an area with pain between her scapulae Lungs: clear to auscultation bilaterally Heart: regular rate and rhythm, systolic murmur: holosystolic 3/6, harsh at 2nd left intercostal space and no click Abdomen: normal findings: no bruits heard, no masses palpable and symmetric and abnormal findings:  absent bowel sounds and mild tenderness in the epigastrium Extremities: extremities normal, atraumatic, no cyanosis or edema Pulses: 2+ and symmetric Neurologic: Alert and oriented X 3, normal strength and tone. Normal symmetric reflexes. Normal coordination and gait  Lab  results: Basic Metabolic Panel:  [facility] 08/18/12 0326  NA 137  K 3.4*  CL 106  CO2 24  GLUCOSE 122*  BUN 14  CREATININE 0.59  CALCIUM 7.9*  MG --  PHOS --   Liver Function Tests:  [facility] 08/18/12 0326  AST 138*  ALT 88*  ALKPHOS 58  BILITOT 1.1  PROT 5.8*  ALBUMIN 3.2*    Basename 08/18/12 0326  LIPASE 36  AMYLASE --   CBC:  Basename 08/18/12 0326  WBC 6.4  NEUTROABS 5.0  HGB 12.0  HCT 35.0*  MCV 88.4  PLT 156      Urinalysis:  Basename 08/18/12 0420  COLORURINE YELLOW  LABSPEC 1.022  PHURINE 6.0  GLUCOSEU NEGATIVE  HGBUR NEGATIVE  BILIRUBINUR NEGATIVE  KETONESUR NEGATIVE  PROTEINUR NEGATIVE  UROBILINOGEN 1.0  NITRITE NEGATIVE  LEUKOCYTESUR SMALL*   Misc. Labs:   Imaging results:  Ct Abdomen Pelvis W Contrast  08/18/2012  *RADIOLOGY REPORT*  Clinical Data: Nausea yesterday.  Syncope.  Epigastric abdominal pain.  Supraumbilical mass.  CT ABDOMEN AND PELVIS WITH CONTRAST  Technique:  Multidetector CT imaging of the abdomen and pelvis was performed following the standard protocol during bolus administration of intravenous contrast.  Contrast: OMNIPAQUE IOHEXOL 300 MG/ML  SOLN  Comparison: None.  Findings: Atelectasis in the lung bases.  Surgical absence of the gallbladder.  Mild intrahepatic bile duct dilatation.  This may be due to postoperative physiology.  No focal liver lesions.  The spleen, pancreas, adrenal glands, kidneys, abdominal aorta, and retroperitoneal lymph nodes are unremarkable. The stomach is somewhat distended but contrast material passes freely into the small bowel suggesting no evidence of obstruction. The small bowel and colon are not abnormally distended.  Stool filled colon.  No free air or free fluid in the abdomen.  Pelvis:  The uterus is not enlarged.  Probable cysts in the right ovary.  Left ovary is not enlarged.  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  No free or loculated pelvic fluid  collections.  The bladder wall is not thickened.  Appendix is normal.  No diverticulitis.  The abdominal wall musculature appears intact.  No hernia is demonstrated.  No abdominal mass lesions. Normal alignment of the lumbar vertebrae.  IMPRESSION: Postoperative cholecystectomy.  Mild bile duct dilatation may represent postoperative physiology.  Pelvic venous varicosities suggesting pelvic congestion.  Probable right ovarian cyst.  No evidence of hernia or mass.  Mild gastric distension.   Original Report Authenticated By: Burman Nieves, M.D.     Other results: No EKG  Assessment & Plan by Problem: The patient is a 36 year old, pleasant woman, Spanish speaking, who presented to the ED with history of abdominal pain for 8 hours.  Infectious Gastroenteritis: Patient's presentation with abdominal pain, vomiting and diarrhea is suggestive of acute infectious gastroenteritis likely viral with norovirus being the most common cause. Physical exam reveals midepigastric tenderness, but with reduced bowel sounds. She does not appear to be overtly dehydrated.Also viral gastroenteritis has pronounced peaks in the winter. There was a history of a family member with similar symptoms. Even though she does not have a fever, in half the patient there is no fever. Another differential consideration in this patient was partial intestinal obstruction given her history of 2 previous abdominal surgeries. However, her CT scan of the abdomen, and pelvis was normal. Lipase was not elevated making pancreatitis unlikely even though she described her pain as radiating to the back. She does not take alcohol. Other differentials to be considered as a UTI but this was unlikely without urinary symptoms and a normal U/A. Other differential considered included acute cholecystitis in view of some elevation in her AST and ALT but her Alkaline Phosphatase was normal which points away from biliary tract obstruction. Her vital signs including  BP and pulse did not suggest volume depletion. We considered outpatient care for this patient but she was in severe pain and therefore decided to admit her for at least one day as her symptoms resolve.    Plan  - NPO for bowel rest and before introducing and advancing the diet as tolerable.  -NGT in situ draining clear fluid (500 cc) - IV fluids at 125 cc per hour  - Morphine 4 mg every 4 hour PRN for pain control  - I.V Zofran for nausea  -admit her of one day for clinical improvement  Elevated AST and ALT: AST of 138 and ALT of 88. Normal alkaline phosphatase. This pattern of liver function is most likely related to a hepatocellular process as opposed to a cholecystic process. She had normal labs 1 year ago. Total bilirubin is normal.   Dispo: Disposition is deferred at this time, awaiting improvement of current medical problems. Anticipated discharge in approximately  1-2day(s).   The patient does not have a current PCP (No primary provider on  file.), therefore is not requiring [facility] follow-up after discharge.   The patient does not have transportation limitations that hinder transportation to clinic appointments.  Signed: Dow Adolph 08/18/2012, 9:40 AM

## 2012-08-18 NOTE — ED Notes (Signed)
Pt states that she is "feeling air in her stomach." Pt started burping and feeling nauseous. Pt given an emesis bag and had 1 emesis.

## 2012-08-18 NOTE — Progress Notes (Signed)
[id number] Patient c/o nausea claims that gastric contents is going back.;noted  Only 50 cc gastric contents the rest are like bright red coming out; very upset and crying; nausea medicine given but no relief she said she wants the tube out. MD notified and CN was sent to room to reassess patient; CN said tube is long and must not be in the stomach. MD in room and agreed to d/c tube.

## 2012-08-18 NOTE — ED Notes (Signed)
PER EMS- Reports having nausea yesterday. Syncopal episode with LOS. Reports 10/10 abdominal pain in epigastric area. Abdomen is tender to palpation. Alertx4, NAD

## 2012-08-18 NOTE — ED Notes (Signed)
Pt transported to CT ?

## 2012-08-18 NOTE — ED Notes (Signed)
Per [phone] secretary, pt will go to [id number].

## 2012-08-19 DIAGNOSIS — R748 Abnormal levels of other serum enzymes: Secondary | ICD-10-CM

## 2012-08-19 DIAGNOSIS — I358 Other nonrheumatic aortic valve disorders: Secondary | ICD-10-CM

## 2012-08-19 HISTORY — DX: Abnormal levels of other serum enzymes: R74.8

## 2012-08-19 HISTORY — DX: Other nonrheumatic aortic valve disorders: I35.8

## 2012-08-19 LAB — CBC
MCH: 31 pg (ref 26.0–34.0)
MCV: 89.2 fL (ref 78.0–100.0)
Platelets: 141 10*3/uL — ABNORMAL LOW (ref 150–400)
RDW: 12.5 % (ref 11.5–15.5)

## 2012-08-19 LAB — COMPREHENSIVE METABOLIC PANEL
ALT: 259 U/L — ABNORMAL HIGH (ref 0–35)
CO2: 24 mEq/L (ref 19–32)
Calcium: 8.3 mg/dL — ABNORMAL LOW (ref 8.4–10.5)
Chloride: 108 mEq/L (ref 96–112)
GFR calc Af Amer: >90 mL/min (ref >90)
GFR calc non Af Amer: >90 mL/min (ref >90)
Glucose, Bld: 151 mg/dL — ABNORMAL HIGH (ref 70–99)
Sodium: 140 mEq/L (ref 135–145)
Total Bilirubin: 1.1 mg/dL (ref 0.3–1.2)

## 2012-08-19 LAB — BASIC METABOLIC PANEL
BUN: 5 mg/dL — ABNORMAL LOW (ref 6–23)
CO2: 23 mEq/L (ref 19–32)
Calcium: 7.8 mg/dL — ABNORMAL LOW (ref 8.4–10.5)
Creatinine, Ser: 0.61 mg/dL (ref 0.50–1.10)
Glucose, Bld: 92 mg/dL (ref 70–99)

## 2012-08-19 LAB — HEPATIC FUNCTION PANEL
Albumin: 2.7 g/dL — ABNORMAL LOW (ref 3.5–5.2)
Alkaline Phosphatase: 110 U/L (ref 39–117)
Indirect Bilirubin: 1 mg/dL — ABNORMAL HIGH (ref 0.3–0.9)
Total Bilirubin: 1.3 mg/dL — ABNORMAL HIGH (ref 0.3–1.2)

## 2012-08-19 MED ORDER — IBUPROFEN 400 MG PO TABS
400.0000 mg | ORAL_TABLET | Freq: Four times a day (QID) | ORAL | Status: DC | PRN
  Administered 2012-08-19: 400 mg via ORAL
  Filled 2012-08-19 (×2): qty 1

## 2012-08-19 MED ORDER — ONDANSETRON HCL 4 MG/2ML IJ SOLN: 4.0000 mg | Freq: Four times a day (QID) | INTRAMUSCULAR | Status: DC | PRN

## 2012-08-19 MED ORDER — ACETAMINOPHEN 325 MG PO TABS: 650.0000 mg | ORAL_TABLET | Freq: Four times a day (QID) | ORAL | Status: DC | PRN

## 2012-08-19 MED ORDER — ONDANSETRON HCL 8 MG PO TABS: 8.0000 mg | ORAL_TABLET | Freq: Three times a day (TID) | ORAL | Status: DC | PRN

## 2012-08-19 MED ORDER — KETOROLAC TROMETHAMINE 30 MG/ML IM SOLN: 30.0000 mg | Freq: Four times a day (QID) | INTRAMUSCULAR | Status: DC | PRN

## 2012-08-19 MED ORDER — KETOROLAC TROMETHAMINE 30 MG/ML IJ SOLN
30.0000 mg | Freq: Four times a day (QID) | INTRAMUSCULAR | Status: DC | PRN
  Administered 2012-08-19 – 2012-08-20 (×2): 30 mg via INTRAVENOUS
  Filled 2012-08-19 (×3): qty 1

## 2012-08-19 NOTE — Progress Notes (Signed)
Subjective: No vomiting since NG tube removed. Had a bowel movement this morning. She feels better and she would like to go home is okay. Objective: Vital signs in last 24 hours: Filed Vitals:   08/18/12 2100 08/19/12 0528 08/19/12 0942 08/19/12 1100  BP: 131/82 93/64 114/71   Pulse: 74 68 71   Temp: 98.7 F (37.1 C) 98.7 F (37.1 C) 98 F (36.7 C)   TempSrc: Oral Oral    Resp: 16 16 16    Height:    5\' 1"  (1.549 m)  Weight:    140 lb (63.504 kg)  SpO2: 99% 94% 98%    Weight change:   Intake/Output Summary (Last 24 hours) at 08/19/12 1201 Last data filed at 08/19/12 1610  Gross per 24 hour  Intake   2600 ml  Output      0 ml  Net   2600 ml   General appearance: alert, cooperative, appears stated age, fatigued, mild distress and in mild pain with NG tube in situ  Head: Normocephalic, without obvious abnormality, atraumatic  Eyes: conjunctivae/corneas clear. PERRL, EOM's intact. Fundi benign.  Throat: lips, mucosa, and tongue normal; teeth and gums normal  Neck: no adenopathy, no carotid bruit, no JVD, supple, symmetrical, trachea midline and thyroid not enlarged, symmetric, no tenderness/mass/nodules  Back: no tenderness to percussion or palpation, she points to an area with pain between her scapulae  Lungs: clear to auscultation bilaterally  Heart: regular rate and rhythm, systolic murmur: holosystolic 3/6, harsh at 2nd left intercostal space and no click  Abdomen: normal findings: no bruits heard, no masses palpable and symmetric and abnormal findings: less tenderness and there is better bowel sounds compared to yesterday Extremities: extremities normal, atraumatic, no cyanosis or edema  Pulses: 2+ and symmetric  Neurologic: Alert and oriented X 3, normal strength and tone. Normal symmetric reflexes. Normal coordination and gait     Lab Results: Basic Metabolic Panel:  Lab 08/19/12 9604 08/18/12 0326  NA 139 137  K 3.3* 3.4*  CL 108 106  CO2 23 24  GLUCOSE 92 122*   BUN 5* 14  CREATININE 0.61 0.59  CALCIUM 7.8* 7.9*  MG -- --  PHOS -- --   Liver Function Tests:  Lab 08/19/12 0625 08/18/12 0326  AST 196* 138*  ALT 263* 88*  ALKPHOS 110 58  BILITOT 1.3* 1.1  PROT 5.3* 5.8*  ALBUMIN 2.7* 3.2*    Lab 08/18/12 0326  LIPASE 36  AMYLASE --   CBC:  Lab 08/19/12 0625 08/18/12 0326  WBC 4.0 6.4  NEUTROABS -- 5.0  HGB 11.5* 12.0  HCT 33.1* 35.0*  MCV 89.2 88.4  PLT 141* 156   Cardiac Enzymes:  Lab 08/18/12 1130  CKTOTAL --  CKMB --  CKMBINDEX --  TROPONINI <0.30   Hemoglobin A1C:  Lab 08/18/12 1134  HGBA1C 5.8*   Coagulation:  Lab 08/19/12 0625  LABPROT 14.4  INR 1.14   Urinalysis:  Lab 08/18/12 0420  COLORURINE YELLOW  LABSPEC 1.022  PHURINE 6.0  GLUCOSEU NEGATIVE  HGBUR NEGATIVE  BILIRUBINUR NEGATIVE  KETONESUR NEGATIVE  PROTEINUR NEGATIVE  UROBILINOGEN 1.0  NITRITE NEGATIVE  LEUKOCYTESUR SMALL*   Misc. Labs:  Micro Results: No results found for this or any previous visit (from the past 240 hour(s)). Studies/Results: Ct Abdomen Pelvis W Contrast  08/18/2012  *RADIOLOGY REPORT*  Clinical Data: Nausea yesterday.  Syncope.  Epigastric abdominal pain.  Supraumbilical mass.  CT ABDOMEN AND PELVIS WITH CONTRAST  Technique:  Multidetector CT imaging  of the abdomen and pelvis was performed following the standard protocol during bolus administration of intravenous contrast.  Contrast: OMNIPAQUE IOHEXOL 300 MG/ML  SOLN  Comparison: None.  Findings: Atelectasis in the lung bases.  Surgical absence of the gallbladder.  Mild intrahepatic bile duct dilatation.  This may be due to postoperative physiology.  No focal liver lesions.  The spleen, pancreas, adrenal glands, kidneys, abdominal aorta, and retroperitoneal lymph nodes are unremarkable. The stomach is somewhat distended but contrast material passes freely into the small bowel suggesting no evidence of obstruction. The small bowel and colon are not abnormally  distended.  Stool filled colon.  No free air or free fluid in the abdomen.  Pelvis:  The uterus is not enlarged.  Probable cysts in the right ovary.  Left ovary is not enlarged.  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  No free or loculated pelvic fluid collections.  The bladder wall is not thickened.  Appendix is normal.  No diverticulitis.  The abdominal wall musculature appears intact.  No hernia is demonstrated.  No abdominal mass lesions. Normal alignment of the lumbar vertebrae.  IMPRESSION: Postoperative cholecystectomy.  Mild bile duct dilatation may represent postoperative physiology.  Pelvic venous varicosities suggesting pelvic congestion.  Probable right ovarian cyst.  No evidence of hernia or mass.  Mild gastric distension.   Original Report Authenticated By: Burman Nieves, M.D.    Medications: I have reviewed the patient's current medications. Scheduled Meds:   . enoxaparin (LOVENOX) injection  40 mg Subcutaneous Daily  . pantoprazole (PROTONIX) IV  40 mg Intravenous QHS   Continuous Infusions:   . sodium chloride 100 mL/hr (08/19/12 1148)   PRN Meds:.acetaminophen, acetaminophen, ibuprofen, ondansetron, phenol, sodium chloride Assessment/Plan: The patient is a 36 year old, pleasant woman, Spanish speaking, who presented to the ED with history of abdominal pain for 8 hours.   Infectious Gastroenteritis: Mr Guerrero's presentation with abdominal pain, vomiting and diarrhea with a history of sick contact was very suggestive of acute infectious gastroenteritis likely viral with norovirus being the most common cause. Physical exam reveals midepigastric tenderness, but with reduced bowel sounds. She does not appear to be overtly dehydrated. Also viral gastroenteritis has pronounced peaks in the winter. There was a history of a family member with similar symptoms. Even though she did not have a fever, in half the patient there is no fever. Another differential  consideration in this patient was partial intestinal obstruction given her history of 2 previous abdominal surgeries. However, her CT scan of the abdomen, and pelvis was normal. Lipase was not elevated making pancreatitis unlikely even though she described her pain as radiating to the back. She does not take alcohol. A UTI was unlikely without urinary symptoms and a normal U/A. Other differential considered included acute cholecystitis in view of some elevation in her AST and ALT but her Alkaline Phosphatase was normal which points away from biliary tract obstruction. Her vital signs including BP and pulse did not suggest volume depletion. We considered outpatient care for this patient but she was in severe pain and therefore decided to admit her for at least one day as her symptoms resolve.  Plan  - changed her to regular diet and if she tolerates it, she will be discharged with follow up as outpatient. - Morphine 4 mg every 4 hour PRN for pain control  - I.V Zofran for nausea  Elevated AST and ALT: AST of 138 and ALT of 88. Normal alkaline phosphatase. This pattern of liver function  is most likely related to a hepatocellular process as opposed to a cholecystic process. She had normal labs 1 year ago. Total bilirubin is normal.  Plan  - Hepatitis panel has been ordered. The results of this can be obtained as outpatient.  Dispo: Disposition is deferred at this time, awaiting improvement of current medical problems. Anticipated discharge in approximately 0-1 day(s).  The patient does not have a current PCP (No primary provider on file.), therefore is not requiring [facility]PC follow-up after discharge.  The patient does not have transportation limitations that hinder transportation to clinic appointments.     LOS: 1 day   Dow Adolph 08/19/2012, 12:01 PM

## 2012-08-19 NOTE — H&P (Signed)
IM Attending  27 woman adm for vomiting and epigastric pain.  Hx of similar syndromes in past.  Had lap chole in 2006 and a prior surgery in [place] before that -- ? dx. Afebrile.  V.S. normal Bmet and CBC OK. AST = 138 and AST = 88. Denies alcohol.  Repeat and check Hep B and C. May be non-specific effect of many viruses.   Agree with discharge today with follow-up.

## 2012-08-19 NOTE — Discharge Summary (Signed)
Internal Medicine [facility] Discharge Note  Name: Misty Gamble MRN: [id number] DOB: 09-Mar-1976 36 y.o.  Date of Admission: 08/18/2012  3:09 AM Date of Discharge: 08/20/2012 Attending Physician: Ulyess Mort, MD  Discharge Diagnosis: Principal Problem:  *Gastroenteritis Active Problems:  Aortic heart murmur on examination  Elevated liver enzymes  Unilateral headache   Discharge Medications:   Medication List     As of 08/20/2012  5:50 PM    STOP taking these medications         aspirin-sod bicarb-citric acid 325 MG Tbef   Commonly known as: ALKA-SELTZER      TAKE these medications         acetaminophen 325 MG tablet   Commonly known as: TYLENOL   Take 2 tablets (650 mg total) by mouth every 6 (six) hours as needed (or Fever >/= 101).      ibuprofen 200 MG tablet   Commonly known as: ADVIL,MOTRIN   Take 2 tablets (400 mg total) by mouth every 6 (six) hours as needed for headache.      multivitamin with minerals Tabs   Take 1 tablet by mouth daily.      ondansetron 8 MG tablet   Commonly known as: ZOFRAN   Take 1 tablet (8 mg total) by mouth every 8 (eight) hours as needed for nausea.      potassium chloride SA 20 MEQ tablet   Commonly known as: K-DUR,KLOR-CON   Take 1 tablet (20 mEq total) by mouth 2 (two) times daily.          Disposition and follow-up:   Misty Gamble was discharged from [facility] in stable condition.    At the hospital follow up visit please address:   -Please do check BMET for hypokalemia - Please evaluate patient for abdominal pain and vomiting.  - Please follow up on hepatitis panel ordered for liver function tests - She will need an echocardiogram to evaluate her aortic valve murmur. - Please assist patient to speak with Edson Snowball and Rudell Cobb regarding PCP  Follow-up Appointments: Follow-up Information    Follow up with KALIA-REYNOLDS, SHELLY, DO. On 08/22/2012. (8:15am)     Contact information:   [address] [place] [address] [phone]         Discharge Orders    Future Appointments: Provider: Department: Dept Phone: Center:   08/21/2012 8:15 AM Priscella Mann, DO [facility] [phone] [facility]     Future Orders Please Complete By Expires   Diet - low sodium heart healthy      Diet - low sodium heart healthy      Increase activity slowly      Call MD for:  persistant nausea and vomiting      Call MD for:  severe uncontrolled pain      Call MD for:  persistant dizziness or light-headedness      Increase activity slowly      Call MD for:  persistant nausea and vomiting      Call MD for:  severe uncontrolled pain      Call MD for:  persistant dizziness or light-headedness      Call MD for:  extreme fatigue         Consultations:  None  Procedures Performed:  Ct Abdomen Pelvis W Contrast  08/18/2012  *RADIOLOGY REPORT*  Clinical Data: Nausea yesterday.  Syncope.  Epigastric abdominal pain.  Supraumbilical mass.  CT ABDOMEN AND PELVIS  WITH CONTRAST  Technique:  Multidetector CT imaging of the abdomen and pelvis was performed following the standard protocol during bolus administration of intravenous contrast.  Contrast: OMNIPAQUE IOHEXOL 300 MG/ML  SOLN  Comparison: None.  Findings: Atelectasis in the lung bases.  Surgical absence of the gallbladder.  Mild intrahepatic bile duct dilatation.  This may be due to postoperative physiology.  No focal liver lesions.  The spleen, pancreas, adrenal glands, kidneys, abdominal aorta, and retroperitoneal lymph nodes are unremarkable. The stomach is somewhat distended but contrast material passes freely into the small bowel suggesting no evidence of obstruction. The small bowel and colon are not abnormally distended.  Stool filled colon.  No free air or free fluid in the abdomen.  Pelvis:  The uterus is not enlarged.  Probable cysts in the right ovary.  Left ovary is  not enlarged.  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  No free or loculated pelvic fluid collections.  The bladder wall is not thickened.  Appendix is normal.  No diverticulitis.  The abdominal wall musculature appears intact.  No hernia is demonstrated.  No abdominal mass lesions. Normal alignment of the lumbar vertebrae.  IMPRESSION: Postoperative cholecystectomy.  Mild bile duct dilatation may represent postoperative physiology.  Pelvic venous varicosities suggesting pelvic congestion.  Probable right ovarian cyst.  No evidence of hernia or mass.  Mild gastric distension.   Original Report Authenticated By: Burman Nieves, M.D.      Admission HPI:  The patient is a 36 year old, pleasant woman, Spanish speaking, who presented to the ED with history of abdominal pain for 8 hours. The pain was on gradual onset and she described it sharp, mainly located in the mid epigastrium and radiating to the back. She also reported that the abdomen felt like full of gas. The pain was intermittent and progressively worsening in 1 hour intervals. The pain was severe to a scale of 9/10 and it reduces to about a 5/10 between episodes. There was associated history of non-bilious, non-blood vomiting, and this has happened about 3-4 times since 2 AM on the day of admission. The vomitus is mainly clear fluids. She had had a bowel movement on the night of admission. She had been feeling unwell for about 2-3 days before presentation to the ED with malaise and some dizziness. However, she denies history of fever or chills. On the night of presentation to the ED, she required the support of her sign because of dizziness. She denies history of melena, or bloody stools. After she had an NG tube passed, she felt her abdomen was getting deflated and the symptoms were gradually improving. Of note, her 46-year-old son had similar symptoms for about the last 1 week with diarrhea, nausea and vomiting. He was at home,  still sick and had not received treatment.  In 2004, she had abdominal pain and swelling and she presented to a hospital in [place], [place] where lapascopic abdominal surgery was perfomed. There was no records about this operation. She reports that during that surgery she suffered a cardiac arrest, but she does not remember much of the details. In 2006, she had an episode of cholecystitis and she had laparoscopic cholecystectomy. She does not have any other history of abdominal surgeries.  The patient is a single mother and lives in an apartment with her 5 children in [place]. She reports that she has had some breakdowns of her water/sewage system in the house for the last several days, and the landlord is hesitant  to have it fixed. She works as a Advertising copywriter. She does not have a primary care physician and, she does not have health insurance.  Her only medications include Motrin, which she took 2 tablets for headache, about 2 days ago. She denies taking a lot of this medication. She is also taking multivitamins and calcium supplements. She does not take any other regular medications. Review of Systems:  Eyes: negative  Respiratory: negative for cough, dyspnea on exertion, emphysema, hemoptysis, pneumonia, sputum and wheezing  Cardiovascular: positive for undiagnosed heart murmur, negative for chest pressure/discomfort, dyspnea, irregular heart beat, near-syncope, orthopnea, palpitations, paroxysmal nocturnal dyspnea and syncope  Genitourinary:negative for dysuria, frequency, hematuria, hesitancy, nocturia and urinary incontinence  Musculoskeletal:negative  Neurological: negative for coordination problems, gait problems, memory problems, paresthesia, tremors and vertigo  Endocrine: negative  Allergic/Immunologic: negative  Physical Exam:  Blood pressure 107/65, pulse 89, temperature 98.8 F (37.1 C), temperature source Oral, resp. rate 10, last menstrual period 08/11/2012, SpO2 95.00%.  General  appearance: alert, cooperative, appears stated age, fatigued, mild distress and in mild pain with NG tube in situ  Head: Normocephalic, without obvious abnormality, atraumatic  Eyes: conjunctivae/corneas clear. PERRL, EOM's intact. Fundi benign.  Throat: lips, mucosa, and tongue normal; teeth and gums normal  Neck: no adenopathy, no carotid bruit, no JVD, supple, symmetrical, trachea midline and thyroid not enlarged, symmetric, no tenderness/mass/nodules  Back: no tenderness to percussion or palpation, she points to an area with pain between her scapulae  Lungs: clear to auscultation bilaterally  Heart: regular rate and rhythm, systolic murmur: holosystolic 3/6, harsh at 2nd left intercostal space and no click  Abdomen: normal findings: no bruits heard, no masses palpable and symmetric and abnormal findings: absent bowel sounds and mild tenderness in the epigastrium  Extremities: extremities normal, atraumatic, no cyanosis or edema  Pulses: 2+ and symmetric  Neurologic: Alert and oriented X 3, normal strength and tone. Normal symmetric reflexes. Normal coordination and gait  Hospital Course by problem list: Infectious Gastroenteritis: Mr Guerrero's presentation with abdominal pain, vomiting and diarrhea with a history of sick contact was very suggestive of acute infectious gastroenteritis likely viral with norovirus being the most common cause. Also viral gastroenteritis has pronounced peaks in the winter. Physical exam reveals midepigastric tenderness, but with reduced bowel sounds. She did not appear to be overtly dehydrated but her pain was very severe. There was a history of a family member with similar symptoms. Another differential consideration in this patient was partial intestinal obstruction given her history of 2 previous abdominal surgeries. However, her CT scan of the abdomen, and pelvis was normal. Lipase was not elevated making pancreatitis unlikely even though she described her pain as  radiating to the back. She does not take alcohol. She was admitted to the hospital at [facility] bed because her pain was severe and outpatient management was not feasible. An NG tube was inserted for 8 hour and symptoms rapidly improved after drainage of about 600 cc of clear fluid and gaseous deflation. She wwas treated with IV morphine and iv fluids. She was discharged with outpatient follow in a few days. He medication on discharged was Motrin and Zofran.  Elevated AST and ALT: AST of 138 and ALT of 88 with normal alkaline phosphatase. This pattern of liver function is most likely related to a hepatocellular process as opposed to a cholecystic process. She had normal labs 1 year ago. Total bilirubin is normal. Hepatitis panel has been ordered. The results of this can be obtained as  outpatient.  Aortic Valvular Murmur: On physical exam, she was found to have a murmur in the aortic area but she denied any cardiovascular symptoms. She had no previous echocardiograms. Given the intensity of this murmur 3/6, she would need outpatient echocardiogram for further evaluation.   Unilateral right sided headache: The way she describes her headaches as one sided on the right and sometimes associated with tingling and numbness on the same side, raises a possibility of migraine. She was discharged with tylenol and ibuprofen. She will be evaluate in outpatient   Discharge Vitals:  BP 118/73  Pulse 66  Temp 97.4 F (36.3 C) (Oral)  Resp 18  Ht 5\' 1"  (1.549 m)  Wt 149 lb 7.6 oz (67.8 kg)  BMI 28.24 kg/m2  SpO2 100%  LMP 08/11/2012  Discharge Labs:  Results for orders placed during the hospital encounter of 08/18/12 (from the past 24 hour(s))  COMPREHENSIVE METABOLIC PANEL     Status: Abnormal   Collection Time   08/20/12  5:15 AM      Component Value Range   Sodium 144  135 - 145 mEq/L   Potassium 3.2 (*) 3.5 - 5.1 mEq/L   Chloride 110  96 - 112 mEq/L   CO2 25  19 - 32 mEq/L   Glucose, Bld 107 (*) 70 -  99 mg/dL   BUN 7  6 - 23 mg/dL   Creatinine, Ser 1.61  0.50 - 1.10 mg/dL   Calcium 8.3 (*) 8.4 - 10.5 mg/dL   Total Protein 5.6 (*) 6.0 - 8.3 g/dL   Albumin 3.0 (*) 3.5 - 5.2 g/dL   AST 85 (*) 0 - 37 U/L   ALT 185 (*) 0 - 35 U/L   Alkaline Phosphatase 108  39 - 117 U/L   Total Bilirubin 0.5  0.3 - 1.2 mg/dL   GFR calc non Af Amer >90  >90 mL/min   GFR calc Af Amer >90  >90 mL/min    Signed: Dow Adolph 08/20/2012, 5:50 PM   Time Spent on Discharge: 45 minutes

## 2012-08-20 ENCOUNTER — Encounter: Payer: Self-pay | Admitting: Internal Medicine

## 2012-08-20 DIAGNOSIS — R7989 Other specified abnormal findings of blood chemistry: Secondary | ICD-10-CM

## 2012-08-20 DIAGNOSIS — K5289 Other specified noninfective gastroenteritis and colitis: Secondary | ICD-10-CM

## 2012-08-20 DIAGNOSIS — R51 Headache: Secondary | ICD-10-CM

## 2012-08-20 LAB — URINE CULTURE

## 2012-08-20 LAB — COMPREHENSIVE METABOLIC PANEL
BUN: 7 mg/dL (ref 6–23)
CO2: 25 mEq/L (ref 19–32)
Chloride: 110 mEq/L (ref 96–112)
Creatinine, Ser: 0.62 mg/dL (ref 0.50–1.10)
GFR calc non Af Amer: >90 mL/min (ref >90)
Total Bilirubin: 0.5 mg/dL (ref 0.3–1.2)

## 2012-08-20 LAB — HEPATITIS PANEL, ACUTE: Hepatitis B Surface Ag: NEGATIVE

## 2012-08-20 MED ORDER — POTASSIUM CHLORIDE CRYS ER 20 MEQ PO TBCR: 20.0000 meq | EXTENDED_RELEASE_TABLET | Freq: Two times a day (BID) | ORAL | Status: DC

## 2012-08-20 MED ORDER — IBUPROFEN 200 MG PO TABS: 400.0000 mg | ORAL_TABLET | Freq: Four times a day (QID) | ORAL | Status: DC | PRN

## 2012-08-20 MED ORDER — POTASSIUM CHLORIDE CRYS ER 20 MEQ PO TBCR
20.0000 meq | EXTENDED_RELEASE_TABLET | Freq: Two times a day (BID) | ORAL | Status: DC
  Administered 2012-08-20: 20 meq via ORAL
  Filled 2012-08-20: qty 1

## 2012-08-20 NOTE — Progress Notes (Signed)
pt vomited and states that there was a little bright red blood in emesis and belging. Ate a cheese burger for lunch and started feeling nausea then, distended and belging. Medicated with Zofran 4mg  iv. Will reassess.

## 2012-08-20 NOTE — Care Management Note (Addendum)
   CARE MANAGEMENT NOTE 08/20/2012  Patient:  Misty Gamble, Misty Gamble   Account Number:  [id number]  Date Initiated:  08/20/2012  Documentation initiated by:  Johny Shock  Subjective/Objective Assessment:   Order for assistance with apartment issues.     Action/Plan:   Unable to assist with pt issues of possible mold in apartment and need for more room.  Will ask social worker to see pt .   Anticipated DC Date:  08/20/2012   Anticipated DC Plan:  HOME/SELF CARE         Choice offered to / List presented to:             Status of service:  Completed, signed off Medicare Important Message given?   (If response is "NO", the following Medicare IM given date fields will be blank) Date Medicare IM given:   Date Additional Medicare IM given:    Discharge Disposition:  HOME/SELF CARE  Per UR Regulation:    If discussed at Long Length of Stay Meetings, dates discussed:    Comments:    Pt issues  of lack of room, possible mold and leaking water, are of concern however pt still has a place to live until her landlord makes repairs . She has heat, water and toilets that flush. These issues are of great concern but do not relate to pt d/c from hospital.

## 2012-08-20 NOTE — Progress Notes (Signed)
Subjective: She did not vomit last night however, she was complains of headaches mainly on the right side. She also reports that she has a lot of problems at home and she is scared to come back if her symptoms recur.  Objective: Vital signs in last 24 hours: Filed Vitals:   08/19/12 2054 08/20/12 0547 08/20/12 1000 08/20/12 1326  BP: 124/76 104/63 119/79 111/65  Pulse: 65 60 71 63  Temp: 98.8 F (37.1 C) 98.9 F (37.2 C) 97.4 F (36.3 C) 98.2 F (36.8 C)  TempSrc: Oral Oral Oral Oral  Resp: 16 16 18 19   Height: 5\' 1"  (1.549 m)     Weight: 149 lb 7.6 oz (67.8 kg)     SpO2: 96% 96% 100% 100%   Weight change: -12 lb 12.5 oz (-5.796 kg)  Intake/Output Summary (Last 24 hours) at 08/20/12 1351 Last data filed at 08/20/12 1300  Gross per 24 hour  Intake   3380 ml  Output      0 ml  Net   3380 ml   General appearance: alert, cooperative, appears stated age, fatigued, mild distress and in mild pain with NG tube in situ  Head: Normocephalic, without obvious abnormality, atraumatic  Eyes: conjunctivae/corneas clear. PERRL, EOM's intact. Fundi benign.  Throat: lips, mucosa, and tongue normal; teeth and gums normal  Neck: no adenopathy, no carotid bruit, no JVD, supple, symmetrical, trachea midline and thyroid not enlarged, symmetric, no tenderness/mass/nodules  Back: no tenderness to percussion or palpation, she points to an area with pain between her scapulae  Lungs: clear to auscultation bilaterally  Heart: regular rate and rhythm, systolic murmur: holosystolic 3/6, harsh at 2nd left intercostal space and no click  Abdomen: normal findings: no bruits heard, no masses palpable and symmetric and abnormal findings: less tenderness and there is better bowel sounds compared to yesterday Extremities: extremities normal, atraumatic, no cyanosis or edema  Pulses: 2+ and symmetric  Neurologic: Alert and oriented X 3, normal strength and tone. Normal symmetric reflexes. Normal coordination and  gait     Lab Results: Basic Metabolic Panel:  Lab 08/20/12 1610 08/19/12 1227  NA 144 140  K 3.2* 3.1*  CL 110 108  CO2 25 24  GLUCOSE 107* 151*  BUN 7 5*  CREATININE 0.62 0.56  CALCIUM 8.3* 8.3*  MG -- --  PHOS -- --   Liver Function Tests:  Lab 08/20/12 0515 08/19/12 1227  AST 85* 166*  ALT 185* 259*  ALKPHOS 108 120*  BILITOT 0.5 1.1  PROT 5.6* 5.9*  ALBUMIN 3.0* 3.1*    Lab 08/18/12 0326  LIPASE 36  AMYLASE --   CBC:  Lab 08/19/12 0625 08/18/12 0326  WBC 4.0 6.4  NEUTROABS -- 5.0  HGB 11.5* 12.0  HCT 33.1* 35.0*  MCV 89.2 88.4  PLT 141* 156   Cardiac Enzymes:  Lab 08/18/12 1130  CKTOTAL --  CKMB --  CKMBINDEX --  TROPONINI <0.30   Hemoglobin A1C:  Lab 08/18/12 1134  HGBA1C 5.8*   Coagulation:  Lab 08/19/12 0625  LABPROT 14.4  INR 1.14   Urinalysis:  Lab 08/18/12 0420  COLORURINE YELLOW  LABSPEC 1.022  PHURINE 6.0  GLUCOSEU NEGATIVE  HGBUR NEGATIVE  BILIRUBINUR NEGATIVE  KETONESUR NEGATIVE  PROTEINUR NEGATIVE  UROBILINOGEN 1.0  NITRITE NEGATIVE  LEUKOCYTESUR SMALL*   Misc. Labs:  Micro Results: Recent Results (from the past 240 hour(s))  URINE CULTURE     Status: Normal   Collection Time   08/18/12  4:20  AM      Component Value Range Status Comment   Specimen Description URINE, CATHETERIZED   Final    Special Requests ADDED 0449   Final    Culture  Setup Time 08/18/2012 16:04   Final    Colony Count 20,OOO COLONIES/ML   Final    Culture     Final    Value: Multiple bacterial morphotypes present, none predominant. Suggest appropriate recollection if clinically indicated.   Report Status 08/20/2012 FINAL   Final    Studies/Results: No results found. Medications: I have reviewed the patient's current medications. Scheduled Meds:    . enoxaparin (LOVENOX) injection  40 mg Subcutaneous Daily  . pantoprazole (PROTONIX) IV  40 mg Intravenous QHS  . potassium chloride SA  20 mEq Oral BID   Continuous Infusions:    .  sodium chloride 100 mL/hr at 08/20/12 0830   PRN Meds:.acetaminophen, acetaminophen, ketorolac, ondansetron, phenol, sodium chloride Assessment/Plan: The patient is a 36 year old, pleasant woman, Spanish speaking, who presented to the ED with history of abdominal pain for 8 hours.   Infectious Gastroenteritis: Mr Guerrero's presentation with abdominal pain, vomiting and diarrhea with a history of sick contact was very suggestive of acute infectious gastroenteritis likely viral with norovirus being the most common cause. Physical exam reveals midepigastric tenderness, but with reduced bowel sounds. She does not appear to be overtly dehydrated. Also viral gastroenteritis has pronounced peaks in the winter. There was a history of a family member with similar symptoms. Even though she did not have a fever, in half the patient there is no fever. Another differential consideration in this patient was partial intestinal obstruction given her history of 2 previous abdominal surgeries. However, her CT scan of the abdomen, and pelvis was normal. Lipase was not elevated making pancreatitis unlikely even though she described her pain as radiating to the back. She does not take alcohol. A UTI was unlikely without urinary symptoms and a normal U/A. Other differential considered included acute cholecystitis in view of some elevation in her AST and ALT but her Alkaline Phosphatase was normal which points away from biliary tract obstruction. Her vital signs including BP and pulse did not suggest volume depletion. We considered outpatient care for this patient but she was in severe pain and therefore decided to admit her for at least one day as her symptoms resolve.  Plan  - changed her to regular diet and if she tolerates it, she will be discharged with follow up as outpatient. - Morphine 4 mg every 4 hour PRN for pain control  - I.V Zofran for nausea  Elevated AST and ALT: AST of 138 and ALT of 88. Normal alkaline  phosphatase. This pattern of liver function is most likely related to a hepatocellular process as opposed to a cholecystic process. She had normal labs 1 year ago. Total bilirubin is normal.  Plan  - Hepatitis panel has been ordered. The results of this can be obtained as outpatient. Unilateral right sided headache: The way she describes her headaches as one sided on the right and sometimes associated with tingling and numbness on the same side, raises a possibility of migraine. She was discharged with tylenol and ibuprofen. She will be evaluate in outpatient.  Dispo: Disposition is deferred at this time, awaiting improvement of current medical problems. Anticipated discharge in approximately 0-1 day(s).  The patient does not have a current PCP (No primary provider on file.), therefore is not requiring OPC follow-up after discharge.  The patient does  not have transportation limitations that hinder transportation to clinic appointments.     LOS: 2 days   Dow Adolph 08/20/2012, 1:51 PM

## 2012-08-20 NOTE — Discharge Summary (Signed)
Pt d/c'd to home with friends, discharge summary complete and pt verbalizes understanding, also gone over with Friend who interpreted in [place]. Iv d/c'd, belongings with pt. Vital signs stable, no complaints of nausea. Prescriptions with pt and follow up appt. made.

## 2012-08-21 ENCOUNTER — Encounter: Payer: Self-pay | Admitting: Internal Medicine

## 2012-08-30 ENCOUNTER — Ambulatory Visit: Payer: Self-pay | Admitting: Internal Medicine

## 2014-09-04 NOTE — L&D Delivery Note (Signed)
Patient is 39 y.o. [id number] [redacted]w[redacted]d admitted 08/10/15 for IOL for hx of IUFD. She also has a history of A1DM. Patient was initially induced with foley bulb as well as cytotec x 1. Foley fell out late last night and, with it, patient SROM at 23:40 and had IUPC placed. Around 4:00 this morning, baby developed prolonged late decel, so amnioinfusion initiated, which resolved late decels. Patient was then started on intermittent pitocin. Baby developed repeat variables, occasionally deep, touching 50-70 bpm, however baby had excellent recovery and beat to beat variability. Patient was able to progress to 9.5 cm with residual anterior cervical lip. Nursing rotated patient and did multiple position changes to help rotate baby's head, which was thought to be ROP. Patient developed increasing pelvic pressure and was able to push past anterior cervical lip immediately prior to delivery. Baby was delivered LOA over intact perineum and was found to have tight nuchal cord x1, unable to reduce at the perineum. Cord was reduced following delivery of the body. 43 cord was cut by MD and he was handed to NICU team for resuscitation.    Delivery Note At 4:42 PM a viable female was delivered via Vaginal, Spontaneous Delivery (Presentation: Left Occiput Anterior). APGAR: 2, 8; weight 7lbs 4oz .  Placenta status: Intact, Spontaneous. Cord: 3 vessels with the following complications: None. Cord pH: 7.027/CO2 84  Anesthesia: Epidural  Episiotomy: None Lacerations: None Est. Blood Loss (mL): 150 cc  Mom to postpartum. Baby to Couplet care / Skin to Skin.  Stormy Card 08/11/2015, 4:56 PM  OB fellow attestation: Patient is a [id number] at [redacted]w[redacted]d who was admitted IOL for hx of IUFD with pregnancy complicated by [id number], AMA. She progressed with augmentation via cytoec, foley bulb and pitocin. Labor course per above with protracted 1st stage with malpresentation affecting dilation.  I was gloved and present for delivery in  its entirety.  Second stage of labor progressed, mother with excellent pushing effort.   Variable decels during second stage noted. Variables were deep and nadir in the 70s with beat to beat variability throughout.  Complications: Tight nuchal cord,  Infant with poor tone and APGAR 2 at 1 minute. Cord gas showed respiratory acidosis.   Lacerations: none  EBL:150  Caren Macadam, MD 7:21 PM

## 2014-10-12 ENCOUNTER — Emergency Department (HOSPITAL_COMMUNITY): Payer: Self-pay

## 2014-10-12 ENCOUNTER — Encounter (HOSPITAL_COMMUNITY): Payer: Self-pay | Admitting: Emergency Medicine

## 2014-10-12 ENCOUNTER — Emergency Department (HOSPITAL_COMMUNITY)
Admission: EM | Admit: 2014-10-12 | Discharge: 2014-10-12 | Disposition: A | Discharge status: Home / Self Care | Payer: Self-pay | Attending: Emergency Medicine | Admitting: Emergency Medicine

## 2014-10-12 DIAGNOSIS — R42 Dizziness and giddiness: Secondary | ICD-10-CM | POA: Insufficient documentation

## 2014-10-12 DIAGNOSIS — D259 Leiomyoma of uterus, unspecified: Secondary | ICD-10-CM | POA: Insufficient documentation

## 2014-10-12 DIAGNOSIS — N939 Abnormal uterine and vaginal bleeding, unspecified: Secondary | ICD-10-CM | POA: Insufficient documentation

## 2014-10-12 DIAGNOSIS — Z3202 Encounter for pregnancy test, result negative: Secondary | ICD-10-CM | POA: Insufficient documentation

## 2014-10-12 DIAGNOSIS — Z79899 Other long term (current) drug therapy: Secondary | ICD-10-CM | POA: Insufficient documentation

## 2014-10-12 DIAGNOSIS — R011 Cardiac murmur, unspecified: Secondary | ICD-10-CM | POA: Insufficient documentation

## 2014-10-12 LAB — WET PREP, GENITAL
CLUE CELLS WET PREP: NONE SEEN
TRICH WET PREP: NONE SEEN
Yeast Wet Prep HPF POC: NONE SEEN

## 2014-10-12 LAB — COMPREHENSIVE METABOLIC PANEL
ALK PHOS: 74 U/L (ref 39–117)
ALT: 22 U/L (ref 0–35)
ANION GAP: 8 (ref 5–15)
AST: 28 U/L (ref 0–37)
Albumin: 4.1 g/dL (ref 3.5–5.2)
BILIRUBIN TOTAL: 1 mg/dL (ref 0.3–1.2)
BUN: 13 mg/dL (ref 6–23)
CO2: 26 mmol/L (ref 19–32)
CREATININE: 0.69 mg/dL (ref 0.50–1.10)
Calcium: 9.1 mg/dL (ref 8.4–10.5)
Chloride: 108 mmol/L (ref 96–112)
GFR calc Af Amer: >90 mL/min (ref >90)
GFR calc non Af Amer: >90 mL/min (ref >90)
Glucose, Bld: 123 mg/dL — ABNORMAL HIGH (ref 70–99)
Potassium: 3.4 mmol/L — ABNORMAL LOW (ref 3.5–5.1)
Sodium: 142 mmol/L (ref 135–145)
Total Protein: 7.3 g/dL (ref 6.0–8.3)

## 2014-10-12 LAB — CBC
HCT: 39.7 % (ref 36.0–46.0)
HEMOGLOBIN: 13.3 g/dL (ref 12.0–15.0)
MCH: 30.2 pg (ref 26.0–34.0)
MCHC: 33.5 g/dL (ref 30.0–36.0)
MCV: 90.2 fL (ref 78.0–100.0)
PLATELETS: 242 10*3/uL (ref 150–400)
RBC: 4.4 MIL/uL (ref 3.87–5.11)
RDW: 12.8 % (ref 11.5–15.5)
WBC: 6.1 10*3/uL (ref 4.0–10.5)

## 2014-10-12 LAB — URINE MICROSCOPIC-ADD ON

## 2014-10-12 LAB — URINALYSIS, ROUTINE W REFLEX MICROSCOPIC
Bilirubin Urine: NEGATIVE
Glucose, UA: NEGATIVE mg/dL
KETONES UR: NEGATIVE mg/dL
LEUKOCYTES UA: NEGATIVE
NITRITE: NEGATIVE
Protein, ur: NEGATIVE mg/dL
SPECIFIC GRAVITY, URINE: 1.014 (ref 1.005–1.030)
Urobilinogen, UA: 1 mg/dL (ref 0.0–1.0)
pH: 7 (ref 5.0–8.0)

## 2014-10-12 LAB — PREGNANCY, URINE: Preg Test, Ur: NEGATIVE

## 2014-10-12 LAB — I-STAT BETA HCG BLOOD, ED (MC, WL, AP ONLY): I-stat hCG, quantitative: <5 m[IU]/mL (ref <5)

## 2014-10-12 LAB — HCG, QUANTITATIVE, PREGNANCY

## 2014-10-12 MED ORDER — SODIUM CHLORIDE 0.9 % IV BOLUS (SEPSIS)
1000.0000 mL | Freq: Once | INTRAVENOUS | Status: AC
  Administered 2014-10-12: 1000 mL via INTRAVENOUS

## 2014-10-12 NOTE — ED Notes (Signed)
[place] Tech in room with patient

## 2014-10-12 NOTE — ED Notes (Signed)
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  Pt states that she thinks that she could be pregnant and wants to check to see if she is or not.

## 2014-10-12 NOTE — Discharge Instructions (Signed)
Please call your doctor for a followup appointment within 24-48 hours. When you talk to your doctor please let them know that you were seen in the emergency department and have them acquire all of your records so that they can discuss the findings with you and formulate a treatment plan to fully care for your new and ongoing problems. Please follow up with OBGYN  Please rest and stay hydrated Please drink plenty of water Please continue to monitor symptoms closely and if symptoms are to worsen or change (fever greater than 101, chills, sweating, nausea, vomiting, chest pain, shortness of breathe, difficulty breathing, weakness, numbness, tingling, worsening or changes to pain pattern, fainting, dizziness, inability to keep food or fluids down) please report back to the Emergency Department immediately.    Fibroids Fibroids are lumps (tumors) that can occur any place in a woman's body. These lumps are not cancerous. Fibroids vary in size, weight, and where they grow. HOME CARE  Do not take aspirin.  Write down the number of pads or tampons you use during your period. Tell your doctor. This can help determine the best treatment for you. GET HELP RIGHT AWAY IF:  You have pain in your lower belly (abdomen) that is not helped with medicine.  You have cramps that are not helped with medicine.  You have more bleeding between or during your period.  You feel lightheaded or pass out (faint).  Your lower belly pain gets worse. MAKE SURE YOU:  Understand these instructions.  Will watch your condition.  Will get help right away if you are not doing well or get worse. Document Released: 09/23/2010 Document Revised: 11/13/2011 Document Reviewed: 09/23/2010 [facility] Patient Information 2015 [place], [place]. This information is not intended to replace advice given to you by your health care provider. Make sure you discuss any questions you have with your health care provider.   Emergency  Department Resource Guide 1) Find a Doctor and Pay Out of Pocket Although you won't have to find out who is covered by your insurance plan, it is a good idea to ask around and get recommendations. You will then need to call the office and see if the doctor you have chosen will accept you as a new patient and what types of options they offer for patients who are self-pay. Some doctors offer discounts or will set up payment plans for their patients who do not have insurance, but you will need to ask so you aren't surprised when you get to your appointment.  2) Contact Your Local Health Department Not all health departments have doctors that can see patients for sick visits, but many do, so it is worth a call to see if yours does. If you don't know where your local health department is, you can check in your phone book. The CDC also has a tool to help you locate your state's health department, and many state websites also have listings of all of their local health departments.  3) Find a [facility] If your illness is not likely to be very severe or complicated, you may want to try a walk in clinic. These are popping up all over the country in pharmacies, drugstores, and shopping centers. They're usually staffed by nurse practitioners or physician assistants that have been trained to treat common illnesses and complaints. They're usually fairly quick and inexpensive. However, if you have serious medical issues or chronic medical problems, these are probably not your best option.  No Primary Care Doctor: -  Call Health Connect at  [phone] - they can help you locate a primary care doctor that  accepts your insurance, provides certain services, etc. - Physician Referral Service- [phone]  Chronic Pain Problems: Organization         Address  Phone   Notes  [facility]  [phone] Patients need to be referred by their primary care doctor.   Medication  Assistance: Organization         Address  Phone   Notes  [facility] Medication [facility] [address] [place], [place] [address] [phone] --Must be a resident of [facility] -- Must have NO insurance coverage whatsoever (no Medicaid/ Medicare, etc.) -- The pt. MUST have a primary care doctor that directs their care regularly and follows them in the community   MedAssist  [phone]   [employer]  [phone]    Agencies that provide inexpensive medical care: Organization         Address  Phone   Notes  [place]  [phone]   [facility]    [phone]   [facility] [place] [employer], [place] [address] [phone]   [place] [address] [place] [phone]   Planned Parenthood    [phone]   [facility]    [phone]   [place] and [address], [place] Phone:  [phone], Fax:  [phone] Hours of Operation:  9 am - 6 pm, M-F.  Also accepts Medicaid/Medicare and self-pay.  [facility] for [place], [address], [place] Phone: [phone], Fax: [phone]. Hours of Operation:  8:30 am - 5:30 pm, M-F.  Also accepts Medicaid and self-pay.  [facility] [place] [address] [place] Phone: [phone]   [place], [place], [place] [phone], Ext. [phone] Mondays & Thursdays: 7-9 AM.  First 15 patients are seen on a first come, first serve basis.    [place] Providers:  Organization         Address  Phone   Notes  [facility] [address], [place] [phone] Also accepts self-pay patients.  [facility] [address], [place]  [phone]   [place], [address], [place]  [phone]   [facility] [address] [place] [phone]   Lucianne Lei [address], [place]   [phone] Only accepts [place] Access [place] patients after they have their name applied to their card.   Self-Pay (no insurance) in [facility]:  Organization         Address  Phone   Notes  Sickle Cell Patients, [facility] Internal [facility] [place] [phone]   [facility] [place] [phone]   [facility] [place]  [place], [place], [place] [phone]   [facility]/Dr. Osei-Bonsu  [address] [place] or [address], [place] [phone] Phone number for both [place] and [place] locations is the same.  Urgent Medical and [facility] [address], [place] [phone]   [place]  [address], [place] or [address] [phone]   [facility] [place] [phone], phone; [phone], fax Sees patients 1st and 3rd Saturday of every month.  Must not qualify for public or private insurance (i.e. Medicaid, Medicare, [place] Health Choice, Veterans' Benefits)  Household income should be no more than 200% of the poverty level The clinic cannot treat you if you are pregnant or think you are pregnant  Sexually transmitted diseases are not treated at the clinic.    Dental Care: Organization         Address  Phone  Notes  [facility] Department of [facility] [place] [phone] Accepts children up to age 14 who are enrolled in [place] or [place]; pregnant women with a Medicaid card; and children who have applied for Medicaid or [place] Health Choice, but were declined, whose parents can pay a reduced fee at time of service.  [facility]  Department of [facility]  [address], [place] [phone] Accepts children up to age 6 who are enrolled in [place] or [place]; pregnant women with a Medicaid card; and children who have applied for Medicaid or [place] Health Choice, but were declined, whose parents can pay a reduced fee at time of service.  [facility] Adult Dental Access PROGRAM  [place] [phone] Patients are seen by appointment only. Walk-ins are not accepted. [place] will see patients 40 years of age and older. Monday - Tuesday (8am-5pm) Most Wednesdays (8:30-5pm) $30 per visit, cash only  [facility] Adult Dental Access PROGRAM  [address], [facility] [phone] Patients are seen by appointment only. Walk-ins are not accepted. [place] will see patients 24 years of age and older. One Wednesday Evening (Monthly: Volunteer Based).  $30 per visit, cash only  [place]  [phone] for adults; Children under age 38, call Graduate Pediatric Dentistry at [phone]. Children aged 38-14, please call [phone] to request a pediatric application.  Dental services are provided in all areas of dental care including fillings, crowns and bridges, complete and partial dentures, implants, gum treatment, root canals, and extractions. Preventive care is also provided. Treatment is provided to both adults and children. Patients are selected via a lottery and there is often a waiting list.   [facility] [address], [place]  [phone] [email or web address]   [facility] [address] [place], [place] [phone], Ext. [phone] Second and Fourth Thursday of each month, opens at 6:30 AM; Clinic ends at 9 AM.  Patients are seen on a first-come first-served basis, and a limited number are seen during each clinic.   [facility]  [address] [place], [place] [phone]    Eligibility Requirements You must have lived in [place], [place], or [place] counties for at least the last three months.   You cannot be eligible for state or federal sponsored [employer], including [employer], [place], or [employer].   You generally cannot be eligible for healthcare insurance through your employer.    How to apply: Eligibility screenings are held every Tuesday and Wednesday afternoon from 1:00 pm until 4:00 pm. You do not need an appointment for the interview!  [facility] [address] [place], [place]   [facility]  Department  [place] Department  [place]  [phone]    Behavioral Health Resources in the Community: Intensive Outpatient Programs Organization         Address  Phone  Notes  [place]. [address] [place], [place] [phone]   [facility] Outpatient [address] [place], [place]   ADS: Alcohol & Drug Svcs [address] [place], [place]   [place] [address]  [place], [place] or [phone]   Substance Abuse Resources Organization         Address  Phone  Notes  Alcohol and Drug Services  [phone]   [place]  [phone]   [facility] [place]   Chinita Pester  [phone]   Residential & Outpatient Substance Abuse Program  [phone]   Psychological Services Organization         Address  Phone  Notes  [facility] [place]  [place]  [phone]   [place] [address] [place] or [phone]    Mobile Crisis Teams Organization         Address  Phone  Notes  [facility]atives, Mobile Crisis Care Unit  [phone]   [facility]ive Psychotherapeutic Services  [address]  [place], [place]   [place] [address] [place] [phone]    Self-Help/Support Groups Organization         Address  Phone             Notes  [place]. of [place] - variety of support groups  [place] Call for more information  Narcotics Anonymous (NA), [facility] [address], [employer] [place]  2 meetings at this location   Special educational needs teacher         Address  Phone  Notes  ASAP Residential Treatment [place],    [place]  [phone]   [facility]  [address] [place] [address], [place], [place]   [place], [place] [phone] Admissions: 8am-3pm M-F  Incentives Substance [place] [address],    [place], [place] [phone]   [facility] [address] [place], [place], [place]   [facility] [address],  [place], [place]   [facility]ight Programs - Intensive Outpatient [address], [place], [place]   [facility] ([place].,  [place], [place] [phone] or [phone]   Residential Treatment Services (RTS) [address] [place], [place] Accepts Medicaid  [facility] [place] [address],  [place] [phone] Substance Abuse/Addiction Treatment   [facility] Organization         Address  Phone  Notes  [facility]  [phone]   Domenic Schwab, PhD [address] [place], [place]   [phone] or [phone]   [place], [place] [phone]   [place] [address] [place], [place] [phone] Insurance/Medicaid/sponsorship through [employer] and Families [address], [address]  [place], [place] [phone] [facility] [place], [place] [phone]    Dr. Adele Schilder  [phone]   [facility] Dept. 1) [address] [place] 2) [place] 3)  [place] [address], [place] [phone]  [phone]   [place] [phone] or [phone] (After Hours)

## 2014-10-12 NOTE — ED Provider Notes (Signed)
CSN: [id number]     Arrival date & time 10/12/14  1405 History   First MD Initiated Contact with Patient 10/12/14 1551     Chief Complaint  Patient presents with  . Vaginal Bleeding     (Consider location/radiation/quality/duration/timing/severity/associated sxs/prior Treatment) The history is provided by the patient. No language interpreter was used.  Misty Gamble is a 39 y/o F with PMHx of elevated liver enzymes, aortic heart murmur presenting to the ED with vaginal bleeding. 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 Reported that she then started to notice that she was having vaginal bleeding. Patient reported that she has been having abnormal vaginal bleeding since 11:00AM this morning. Patient reported that she has changed her pad at least 4 times today. Reported that the bleeding is a bright red blood and stated that there is small blood clots with clear mucous discharge. Reported that she has been experiencing mild lightheadedness and nausea. Patient reported that her LMP was 09/03/2014. Stated that she normally has regular menstrual cycles, every month - stated that they last anywhere from 4-7 days. Patient reported that her and her boyfriend are sexually active and stated that they did have a sexual encounter on 09/17/2014 and stated that she was trying to get pregnant. Patient reported that she has not taken a urine pregnancy test and stated that she does not know if she is pregnant. [id number]. Denied fever, chest pain, shortness of breath, difficulty breathing, syncope, blurred vision, sudden loss of vision, dysuria, hematochezia, melena, diarrhea, vaginal pain, vaginal irritation, dizziness. OB/GYN none PCP none  Past Medical History  Diagnosis Date  . Aortic heart murmur on examination 08/19/2012    No previous  Echo   . Elevated liver enzymes 08/19/2012   Past Surgical History  Procedure Laterality Date  . Cholecystectomy     No family history on file. History  Substance Use Topics  . Smoking status: Never Smoker   . Smokeless tobacco: Not on file  . Alcohol Use: No   OB History    No data available     Review of Systems  Constitutional: Negative for fever and chills.  Eyes: Negative for visual disturbance.  Respiratory: Negative for chest tightness and shortness of breath.   Cardiovascular: Negative for chest pain.  Gastrointestinal: Positive for nausea. Negative for vomiting, abdominal pain, diarrhea, constipation, blood in stool and anal bleeding.  Genitourinary: Positive for vaginal bleeding and pelvic pain. Negative for dysuria, hematuria, vaginal discharge and vaginal pain.  Neurological: Positive for light-headedness. Negative for dizziness, syncope and weakness.      Allergies  Ciprofloxacin  Home Medications   Prior to Admission medications   Medication Sig Start Date End Date Taking? Authorizing Provider  acetaminophen (TYLENOL) 325 MG tablet Take 2 tablets (650 mg total) by mouth every 6 (six) hours as needed (or Fever >/= 101). Patient not taking: Reported on 10/12/2014 08/19/12   Jessee Avers, MD  ibuprofen (MOTRIN IB) 200 MG tablet Take 2 tablets (400 mg total) by mouth every 6 (six) hours as needed for headache. Patient not taking: Reported on 10/12/2014 08/20/12   Jessee Avers, MD  Multiple Vitamin (MULTIVITAMIN WITH MINERALS) TABS Take 1 tablet by mouth daily.    Historical Provider, MD  ondansetron (ZOFRAN) 8 MG tablet Take 1 tablet (8 mg total) by mouth every 8 (eight)  hours as needed for nausea. Patient not taking: Reported on 10/12/2014 08/19/12   Jessee Avers, MD  potassium chloride SA (K-DUR,KLOR-CON) 20 MEQ tablet Take 1 tablet (20 mEq total) by mouth 2 (two) times daily. Patient not taking: Reported on 10/12/2014 08/20/12   Jessee Avers, MD   BP  118/75 mmHg  Pulse 60  Temp(Src) 97.6 F (36.4 C) (Oral)  Resp 16  Ht 5\' 1"  (1.549 m)  Wt 142 lb 2 oz (64.467 kg)  BMI 26.87 kg/m2  SpO2 100%  LMP 09/13/2014 Physical Exam  Constitutional: She is oriented to person, place, and time. She appears well-developed and well-nourished. No distress.  HENT:  Head: Normocephalic and atraumatic.  Eyes: Conjunctivae and EOM are normal. Pupils are equal, round, and reactive to light. Right eye exhibits no discharge. Left eye exhibits no discharge.  Neck: Normal range of motion. Neck supple. No tracheal deviation present.  Cardiovascular: Normal rate, regular rhythm and normal heart sounds.  Exam reveals no friction rub.   No murmur heard. Pulses:      Radial pulses are 2+ on the right side, and 2+ on the left side.  Pulmonary/Chest: Effort normal and breath sounds normal. No respiratory distress. She has no wheezes. She has no rales.  Abdominal: Soft. Bowel sounds are normal. She exhibits no distension. There is tenderness in the suprapubic area. There is no rebound and no guarding.  Genitourinary:  Pelvic Exam: Negative swelling, erythema, inflammation, lesions, sores, deformities noted to the external region. Bright red blood noted in the vaginal vault. Bleeding coming from the cervix. Negative CMT. Bilateral adnexal discomfort noted on examination.  Exam chaperoned with nurse.   Musculoskeletal: Normal range of motion.  Lymphadenopathy:    She has no cervical adenopathy.  Neurological: She is alert and oriented to person, place, and time. No cranial nerve deficit. She exhibits normal muscle tone. Coordination normal.  Skin: Skin is warm and dry. No rash noted. She is not diaphoretic. No erythema.  Psychiatric: She has a normal mood and affect. Her behavior is normal. Thought content normal.  Nursing note and vitals reviewed.   ED Course  Procedures (including critical care time)  Results for orders placed or performed during the hospital  encounter of 10/12/14  Wet prep, genital  Result Value Ref Range   Yeast Wet Prep HPF POC NONE SEEN NONE SEEN   Trich, Wet Prep NONE SEEN NONE SEEN   Clue Cells Wet Prep HPF POC NONE SEEN NONE SEEN   WBC, Wet Prep HPF POC FEW (A) NONE SEEN  Pregnancy, urine (if pt is pre-menopausal female) at [facility]  Result Value Ref Range   Preg Test, Ur NEGATIVE NEGATIVE  hCG, quantitative, pregnancy (if pregnant, but not confirmed to be intrauterine pregnancy [vs ectopic])  Result Value Ref Range   hCG, Beta Chain, Quant, S <1 <5 mIU/mL  CBC  (if HCG negative, but HR >90, SBP <90, dizzy and/or over 1 pad per hour saturated/bleeding)  Result Value Ref Range   WBC 6.1 4.0 - 10.5 K/uL   RBC 4.40 3.87 - 5.11 MIL/uL   Hemoglobin 13.3 12.0 - 15.0 g/dL   HCT 39.7 36.0 - 46.0 %   MCV 90.2 78.0 - 100.0 fL   MCH 30.2 26.0 - 34.0 pg   MCHC 33.5 30.0 - 36.0 g/dL   RDW 12.8 11.5 - 15.5 %   Platelets 242 150 - 400 K/uL  Urinalysis, Routine w reflex microscopic  Result Value Ref Range   Color, Urine  YELLOW YELLOW   APPearance CLEAR CLEAR   Specific Gravity, Urine 1.014 1.005 - 1.030   pH 7.0 5.0 - 8.0   Glucose, UA NEGATIVE NEGATIVE mg/dL   Hgb urine dipstick LARGE (A) NEGATIVE   Bilirubin Urine NEGATIVE NEGATIVE   Ketones, ur NEGATIVE NEGATIVE mg/dL   Protein, ur NEGATIVE NEGATIVE mg/dL   Urobilinogen, UA 1.0 0.0 - 1.0 mg/dL   Nitrite NEGATIVE NEGATIVE   Leukocytes, UA NEGATIVE NEGATIVE  Comprehensive metabolic panel  Result Value Ref Range   Sodium 142 135 - 145 mmol/L   Potassium 3.4 (L) 3.5 - 5.1 mmol/L   Chloride 108 96 - 112 mmol/L   CO2 26 19 - 32 mmol/L   Glucose, Bld 123 (H) 70 - 99 mg/dL   BUN 13 6 - 23 mg/dL   Creatinine, Ser 0.69 0.50 - 1.10 mg/dL   Calcium 9.1 8.4 - 10.5 mg/dL   Total Protein 7.3 6.0 - 8.3 g/dL   Albumin 4.1 3.5 - 5.2 g/dL   AST 28 0 - 37 U/L   ALT 22 0 - 35 U/L   Alkaline Phosphatase 74 39 - 117 U/L   Total Bilirubin 1.0 0.3 - 1.2 mg/dL   GFR calc non Af Amer >90  >90 mL/min   GFR calc Af Amer >90 >90 mL/min   Anion gap 8 5 - 15  Urine microscopic-add on  Result Value Ref Range   Squamous Epithelial / LPF RARE RARE   RBC / HPF 7-10 <3 RBC/hpf   Bacteria, UA RARE RARE  I-Stat beta hCG blood, ED ([facility], [facility], [facility] only)  Result Value Ref Range   I-stat hCG, quantitative <5.0 <5 mIU/mL   Comment 3            Labs Review Labs Reviewed  WET PREP, GENITAL - Abnormal; Notable for the following:    WBC, Wet Prep HPF POC FEW (*)    All other components within normal limits  URINALYSIS, ROUTINE W REFLEX MICROSCOPIC - Abnormal; Notable for the following:    Hgb urine dipstick LARGE (*)    All other components within normal limits  COMPREHENSIVE METABOLIC PANEL - Abnormal; Notable for the following:    Potassium 3.4 (*)    Glucose, Bld 123 (*)    All other components within normal limits  PREGNANCY, URINE  HCG, QUANTITATIVE, PREGNANCY  CBC  URINE MICROSCOPIC-ADD ON  I-STAT BETA HCG BLOOD, ED ([facility], [facility], [facility] ONLY)  GC/CHLAMYDIA PROBE AMP ([place])    Imaging Review US Transvaginal Non-ob  10/12/2014   CLINICAL DATA:  Vaginal bleeding  EXAM: TRANSABDOMINAL AND TRANSVAGINAL ULTRASOUND OF PELVIS  TECHNIQUE: Study was performed transabdominally to optimize pelvic field of view evaluation and transvaginally to optimize internal visceral architecture evaluation.  COMPARISON:  CT abdomen and pelvis August 18, 2012  FINDINGS: Uterus  Measurements: 6.6 x 4.3 x 4.9 cm. The echotexture of the uterus is mildly inhomogeneous suggesting that there may be leiomyomatous change in the uterus. No dominant well-defined mass is seen, however. There are several cervical nabothian cysts, largest measuring 1.4 cm in maximum diameter.  Endometrium  Thickness: 6 mm.  No focal abnormality visualized.  Right ovary  Measurements: 2.5 x 1.6 x 2.1 cm. Normal appearance/no adnexal mass.  Left ovary  Measurements: 2.7 x 1.6 x 2.2 cm. Normal appearance/no adnexal mass.  Other findings  No  free fluid.  IMPRESSION: The uterus has a mildly inhomogeneous echotexture. Question leiomyomatous change in the uterus without well-defined mass. There are  several small nabothian cysts arising from cervix.  Study otherwise unremarkable.   Electronically Signed   By: Lowella Grip M.D.   On: 10/12/2014 19:50   US Pelvis Complete  10/12/2014   CLINICAL DATA:  Vaginal bleeding  EXAM: TRANSABDOMINAL AND TRANSVAGINAL ULTRASOUND OF PELVIS  TECHNIQUE: Study was performed transabdominally to optimize pelvic field of view evaluation and transvaginally to optimize internal visceral architecture evaluation.  COMPARISON:  CT abdomen and pelvis August 18, 2012  FINDINGS: Uterus  Measurements: 6.6 x 4.3 x 4.9 cm. The echotexture of the uterus is mildly inhomogeneous suggesting that there may be leiomyomatous change in the uterus. No dominant well-defined mass is seen, however. There are several cervical nabothian cysts, largest measuring 1.4 cm in maximum diameter.  Endometrium  Thickness: 6 mm.  No focal abnormality visualized.  Right ovary  Measurements: 2.5 x 1.6 x 2.1 cm. Normal appearance/no adnexal mass.  Left ovary  Measurements: 2.7 x 1.6 x 2.2 cm. Normal appearance/no adnexal mass.  Other findings  No free fluid.  IMPRESSION: The uterus has a mildly inhomogeneous echotexture. Question leiomyomatous change in the uterus without well-defined mass. There are several small nabothian cysts arising from cervix.  Study otherwise unremarkable.   Electronically Signed   By: Lowella Grip M.D.   On: 10/12/2014 19:50     EKG Interpretation None       Orthostatic VS for the past 24 hrs:  BP- Lying Pulse- Lying BP- Sitting Pulse- Sitting BP- Standing at 0 minutes Pulse- Standing at 0 minutes  10/12/14 1729 (!) 111/92 mmHg 83 141/87 mmHg 88 146/89 mmHg 76       MDM   Final diagnoses:  Vaginal bleeding  Uterine leiomyoma, unspecified location    Medications  sodium chloride 0.9 % bolus 1,000 mL (0  mLs Intravenous Stopped 10/12/14 1834)   Filed Vitals:   10/12/14 1416 10/12/14 1735 10/12/14 2024  BP: 139/65 111/92 118/75  Pulse: 65 80 60  Temp: 97.6 F (36.4 C)    TempSrc: Oral    Resp: 18 15 16   Height: 5\' 1"  (1.549 m)    Weight: 142 lb 2 oz (64.467 kg)    SpO2: 97% 100% 100%    CBC unremarkable-negative elevated leukocytosis, hemoglobin 13.3, hematocrit 39.7. CMP unremarkable-mildly low potassium 3.4-negative elevated liver enzymes. Beta hCG less than 1. Urine pregnancy negative. Urinalysis noted large hemoglobin with negative nitrites or leukocytes. Wet prep unremarkable. GC Chlamydia probe pending. Pelvic ultrasound noted uterus has a mildly inhomogenous echotexture, leiomyomatous change in the uterus without well-defined mass-are several small nabothian cysts arising from the cervix. Negative findings of UTI or pyelonephritis. Urine and blood work negative for pregnancy. Negative drop in hemoglobin. Possible miscarriage, less likely secondary to low beta hCG and urine pregnancy negative - could still be? Appears to be uterine fibroids leading to abnormal bleeding. Patient hydrated in the ED setting. Patient stable, afebrile. Patient not septic appearing. Reviewed imaging and labs with attending physician, Dr. Adron Bene - agreed to plan of discharge and outpatient follow up. Discharged patient. Discussed with patient to rest and stay hydrated. Referred to OBGYN. Recommended patient to be seen later this week at [facility] for repeat US - patient understood and agreed. Discussed with patient to closely monitor symptoms and if symptoms are to worsen or change to report back to the ED - strict return instructions given.  Patient agreed to plan of care, understood, all questions answered.   Jamse Mead, PA-C 10/12/14 2104  Jamse Mead, PA-C  10/12/14 2117  Houston Siren III, MD 10/13/14 [phone]

## 2014-10-13 LAB — GC/CHLAMYDIA PROBE AMP (~~LOC~~) NOT AT ARMC
CHLAMYDIA, DNA PROBE: NEGATIVE
NEISSERIA GONORRHEA: NEGATIVE

## 2014-11-09 ENCOUNTER — Encounter: Payer: Self-pay | Admitting: Family Medicine

## 2014-11-09 ENCOUNTER — Telehealth: Payer: Self-pay

## 2014-11-09 NOTE — Telephone Encounter (Signed)
Patient missed appointment today for vaginal bleeding/fibroids/ f/u ED. Attempted to contact patient. No answer. Left message stating we are sorry you missed your appointment, please call clinic to reschedule. Will also send letter.

## 2015-01-09 ENCOUNTER — Emergency Department (HOSPITAL_COMMUNITY): Payer: Self-pay

## 2015-01-09 ENCOUNTER — Encounter (HOSPITAL_COMMUNITY): Payer: Self-pay | Admitting: Emergency Medicine

## 2015-01-09 ENCOUNTER — Emergency Department (HOSPITAL_COMMUNITY)
Admission: EM | Admit: 2015-01-09 | Discharge: 2015-01-09 | Disposition: A | Discharge status: Home / Self Care | Payer: Self-pay | Attending: Emergency Medicine | Admitting: Emergency Medicine

## 2015-01-09 DIAGNOSIS — Z3A08 8 weeks gestation of pregnancy: Secondary | ICD-10-CM | POA: Insufficient documentation

## 2015-01-09 DIAGNOSIS — O26899 Other specified pregnancy related conditions, unspecified trimester: Secondary | ICD-10-CM

## 2015-01-09 DIAGNOSIS — R011 Cardiac murmur, unspecified: Secondary | ICD-10-CM | POA: Insufficient documentation

## 2015-01-09 DIAGNOSIS — O200 Threatened abortion: Secondary | ICD-10-CM | POA: Insufficient documentation

## 2015-01-09 DIAGNOSIS — M549 Dorsalgia, unspecified: Secondary | ICD-10-CM | POA: Insufficient documentation

## 2015-01-09 DIAGNOSIS — R109 Unspecified abdominal pain: Secondary | ICD-10-CM

## 2015-01-09 DIAGNOSIS — Z79899 Other long term (current) drug therapy: Secondary | ICD-10-CM | POA: Insufficient documentation

## 2015-01-09 LAB — CBC
HEMATOCRIT: 40.2 % (ref 36.0–46.0)
Hemoglobin: 14.1 g/dL (ref 12.0–15.0)
MCH: 31 pg (ref 26.0–34.0)
MCHC: 35.1 g/dL (ref 30.0–36.0)
MCV: 88.4 fL (ref 78.0–100.0)
PLATELETS: 215 10*3/uL (ref 150–400)
RBC: 4.55 MIL/uL (ref 3.87–5.11)
RDW: 12.5 % (ref 11.5–15.5)
WBC: 8.1 10*3/uL (ref 4.0–10.5)

## 2015-01-09 LAB — URINALYSIS, ROUTINE W REFLEX MICROSCOPIC
Bilirubin Urine: NEGATIVE
Glucose, UA: NEGATIVE mg/dL
HGB URINE DIPSTICK: NEGATIVE
Ketones, ur: NEGATIVE mg/dL
Leukocytes, UA: NEGATIVE
Nitrite: NEGATIVE
Protein, ur: NEGATIVE mg/dL
SPECIFIC GRAVITY, URINE: 1.01 (ref 1.005–1.030)
Urobilinogen, UA: 0.2 mg/dL (ref 0.0–1.0)
pH: 6.5 (ref 5.0–8.0)

## 2015-01-09 LAB — WET PREP, GENITAL
Clue Cells Wet Prep HPF POC: NONE SEEN
Trich, Wet Prep: NONE SEEN
WBC, Wet Prep HPF POC: NONE SEEN
Yeast Wet Prep HPF POC: NONE SEEN

## 2015-01-09 LAB — ABO/RH: ABO/RH(D): O POS

## 2015-01-09 LAB — HCG, QUANTITATIVE, PREGNANCY: hCG, Beta Chain, Quant, S: 128351 m[IU]/mL — ABNORMAL HIGH (ref <5)

## 2015-01-09 NOTE — ED Notes (Signed)
Spoken to lab and reported that HCG had an abnormal reading and had to run test manually.

## 2015-01-09 NOTE — ED Provider Notes (Signed)
CSN: [id number]     Arrival date & time 01/09/15  1422 History   First MD Initiated Contact with Patient 01/09/15 1531     Chief Complaint  Patient presents with  . Abdominal Cramping  . pregnant      (Consider location/radiation/quality/duration/timing/severity/associated sxs/prior Treatment) HPI 39 year old female presents after noticing fluid in her bed last night. She is approximately [redacted] weeks pregnant, LMP was March 8. She's also complaining of lower abdominal cramping. Has had some low back pain as well. This is her eighth pregnancy. The patient noticed some blood when she wiped this morning as well. She's concerned that her water broke. Rates the pain as moderate. No fevers or chills. No urinary symptoms.   Past Medical History  Diagnosis Date  . Aortic heart murmur on examination 08/19/2012    No previous Echo   . Elevated liver enzymes 08/19/2012   Past Surgical History  Procedure Laterality Date  . Cholecystectomy     History reviewed. No pertinent family history. History  Substance Use Topics  . Smoking status: Never Smoker   . Smokeless tobacco: Not on file  . Alcohol Use: No   OB History    Gravida Para Term Preterm AB TAB SAB Ectopic Multiple Living   1              Review of Systems  Constitutional: Negative for fever.  Gastrointestinal: Positive for abdominal pain. Negative for vomiting.  Genitourinary: Positive for menstrual problem. Negative for dysuria, vaginal bleeding and vaginal discharge.  Musculoskeletal: Positive for back pain.  All other systems reviewed and are negative.     Allergies  Ciprofloxacin  Home Medications   Prior to Admission medications   Medication Sig Start Date End Date Taking? Authorizing Provider  acetaminophen (TYLENOL) 325 MG tablet Take 2 tablets (650 mg total) by mouth every 6 (six) hours as needed (or Fever >/= 101). Patient not taking: Reported on 10/12/2014 08/19/12   Jessee Avers, MD  ibuprofen (MOTRIN IB) 200  MG tablet Take 2 tablets (400 mg total) by mouth every 6 (six) hours as needed for headache. Patient not taking: Reported on 10/12/2014 08/20/12   Jessee Avers, MD  Multiple Vitamin (MULTIVITAMIN WITH MINERALS) TABS Take 1 tablet by mouth daily.    Historical Provider, MD  ondansetron (ZOFRAN) 8 MG tablet Take 1 tablet (8 mg total) by mouth every 8 (eight) hours as needed for nausea. Patient not taking: Reported on 10/12/2014 08/19/12   Jessee Avers, MD  potassium chloride SA (K-DUR,KLOR-CON) 20 MEQ tablet Take 1 tablet (20 mEq total) by mouth 2 (two) times daily. Patient not taking: Reported on 10/12/2014 08/20/12   Jessee Avers, MD   BP 125/78 mmHg  Pulse 90  Temp(Src) 98.1 F (36.7 C) (Oral)  Resp 18  SpO2 100%  LMP 11/10/2014 Physical Exam  Constitutional: She is oriented to person, place, and time. She appears well-developed and well-nourished.  HENT:  Head: Normocephalic and atraumatic.  Right Ear: External ear normal.  Left Ear: External ear normal.  Nose: Nose normal.  Eyes: Right eye exhibits no discharge. Left eye exhibits no discharge.  Cardiovascular: Normal rate, regular rhythm and normal heart sounds.   Pulmonary/Chest: Effort normal and breath sounds normal.  Abdominal: Soft. There is tenderness in the suprapubic area. There is no CVA tenderness.  Genitourinary: Cervix exhibits no motion tenderness and no discharge. No bleeding in the vagina.  Cervical os is closed, mild tenderness  Neurological: She is alert and oriented to person,  place, and time.  Skin: Skin is warm and dry.  Nursing note and vitals reviewed.   ED Course  Procedures (including critical care time) Labs Review Labs Reviewed  HCG, QUANTITATIVE, PREGNANCY - Abnormal; Notable for the following:    hCG, Beta Chain, Laqueta Carina [id number] (*)    All other components within normal limits  WET PREP, GENITAL  URINE CULTURE  CBC  URINALYSIS, ROUTINE W REFLEX MICROSCOPIC  ABO/RH  GC/CHLAMYDIA PROBE AMP  ()    Imaging Review US Ob Comp Less 14 Wks  01/09/2015   CLINICAL DATA:  Abdominal cramping and spotting. First trimester pregnancy.  EXAM: OBSTETRIC <14 WK [place] AND TRANSVAGINAL OB US  TECHNIQUE: Both transabdominal and transvaginal ultrasound examinations were performed for complete evaluation of the gestation as well as the maternal uterus, adnexal regions, and pelvic cul-de-sac. Transvaginal technique was performed to assess early pregnancy.  COMPARISON:  10/12/2014 pelvic ultrasound  FINDINGS: Intrauterine gestational sac: Visualized/normal in shape.  Yolk sac:  Present.  Embryo:  Present.  Cardiac Activity: Present.  Heart Rate: 145  bpm  CRL:  18.6  mm   8 w   3 d                  [place] EDC: 08/18/2015  Maternal uterus/adnexae: No subchorionic hemorrhage or free fluid identified. The ovaries were normal in appearance, with a corpus luteum noted in the right ovary.  IMPRESSION: Single viable IUP as above.   Electronically Signed   By: Logan Bores   On: 01/09/2015 17:52   US Ob Transvaginal  01/09/2015   CLINICAL DATA:  Abdominal cramping and spotting. First trimester pregnancy.  EXAM: OBSTETRIC <14 WK [place] AND TRANSVAGINAL OB US  TECHNIQUE: Both transabdominal and transvaginal ultrasound examinations were performed for complete evaluation of the gestation as well as the maternal uterus, adnexal regions, and pelvic cul-de-sac. Transvaginal technique was performed to assess early pregnancy.  COMPARISON:  10/12/2014 pelvic ultrasound  FINDINGS: Intrauterine gestational sac: Visualized/normal in shape.  Yolk sac:  Present.  Embryo:  Present.  Cardiac Activity: Present.  Heart Rate: 145  bpm  CRL:  18.6  mm   8 w   3 d                  [place] EDC: 08/18/2015  Maternal uterus/adnexae: No subchorionic hemorrhage or free fluid identified. The ovaries were normal in appearance, with a corpus luteum noted in the right ovary.  IMPRESSION: Single viable IUP as above.   Electronically Signed   By: Logan Bores    On: 01/09/2015 17:52     EKG Interpretation None      MDM   Final diagnoses:  Threatened miscarriage in early pregnancy    Patient's presentation is consistent with a threatened miscarriage. No current vaginal bleeding and appears to have a closed cervical os. Has an intrauterine pregnancy with heart rate around 14 5 bpm. No evidence of ectopic. Stable for discharge and follow-up with OB/GYN. Is currently are on prenatal vitamins. Blood type is O+, does not need rhogam.    Sherwood Gambler, MD 01/10/15 [phone]

## 2015-01-09 NOTE — ED Notes (Signed)
Awake. Verbally responsive. A/O x4. Resp even and unlabored. No audible adventitious breath sounds noted. ABC's intact.  

## 2015-01-09 NOTE — ED Notes (Signed)
Awake. Verbally responsive. Resp even and unlabored. No audible adventitious breath sounds noted. ABC's intact. Abd soft/nondistended but tender to palpate.  No N/V/D reported since arrival.

## 2015-01-09 NOTE — ED Notes (Signed)
Awake. Verbally responsive. A/O x4. Resp even and unlabored. No audible adventitious breath sounds noted. ABC's intact. Pt ambulated to BR with steady gait.

## 2015-01-09 NOTE — ED Notes (Addendum)
Patient [redacted] weeks pregnant, LMP March 8, c/o contractions, pressure, and pain in low abdomen and back. Pt states that last night while sleeping, patient noted some clear water on her leg, when patient stood from bed, patient noted significant amount of clear, colorless, odorless fluid coming from perineal area for several seconds. Pt denies any blood or discharge.  Pt states she has not had ultrasound to confirm pregnancy is in the uterus.

## 2015-01-09 NOTE — ED Notes (Signed)
Awake. Verbally responsive. Resp even and unlabored. No audible adventitious breath sounds noted. ABC's intact. Abd soft/nondistended but tender to palpate. BS (+) and active x4 quadrants. Pt reported N/V x2 in 24hrs. Family at bedside.Marland Kitchen

## 2015-01-10 LAB — URINE CULTURE
COLONY COUNT: NO GROWTH
CULTURE: NO GROWTH

## 2015-01-11 LAB — GC/CHLAMYDIA PROBE AMP (~~LOC~~) NOT AT ARMC
CHLAMYDIA, DNA PROBE: NEGATIVE
Neisseria Gonorrhea: NEGATIVE

## 2015-03-07 ENCOUNTER — Encounter (HOSPITAL_COMMUNITY): Payer: Self-pay | Admitting: Emergency Medicine

## 2015-03-07 ENCOUNTER — Emergency Department (HOSPITAL_COMMUNITY)
Admission: EM | Admit: 2015-03-07 | Discharge: 2015-03-07 | Disposition: A | Discharge status: Home / Self Care | Payer: Self-pay | Attending: Emergency Medicine | Admitting: Emergency Medicine

## 2015-03-07 DIAGNOSIS — S0990XA Unspecified injury of head, initial encounter: Secondary | ICD-10-CM | POA: Insufficient documentation

## 2015-03-07 DIAGNOSIS — Z79899 Other long term (current) drug therapy: Secondary | ICD-10-CM | POA: Insufficient documentation

## 2015-03-07 DIAGNOSIS — J300 Vasomotor rhinitis: Secondary | ICD-10-CM | POA: Insufficient documentation

## 2015-03-07 DIAGNOSIS — Y998 Other external cause status: Secondary | ICD-10-CM | POA: Insufficient documentation

## 2015-03-07 DIAGNOSIS — Y9389 Activity, other specified: Secondary | ICD-10-CM | POA: Insufficient documentation

## 2015-03-07 DIAGNOSIS — Y9289 Other specified places as the place of occurrence of the external cause: Secondary | ICD-10-CM | POA: Insufficient documentation

## 2015-03-07 DIAGNOSIS — W01198A Fall on same level from slipping, tripping and stumbling with subsequent striking against other object, initial encounter: Secondary | ICD-10-CM | POA: Insufficient documentation

## 2015-03-07 DIAGNOSIS — R55 Syncope and collapse: Secondary | ICD-10-CM | POA: Insufficient documentation

## 2015-03-07 DIAGNOSIS — Z8679 Personal history of other diseases of the circulatory system: Secondary | ICD-10-CM | POA: Insufficient documentation

## 2015-03-07 LAB — BASIC METABOLIC PANEL
Anion gap: 9 (ref 5–15)
BUN: <5 mg/dL — ABNORMAL LOW (ref 6–20)
CO2: 24 mmol/L (ref 22–32)
Calcium: 8.6 mg/dL — ABNORMAL LOW (ref 8.9–10.3)
Chloride: 104 mmol/L (ref 101–111)
Creatinine, Ser: 0.5 mg/dL (ref 0.44–1.00)
GFR calc Af Amer: >60 mL/min (ref >60)
GFR calc non Af Amer: >60 mL/min (ref >60)
Glucose, Bld: 87 mg/dL (ref 65–99)
Potassium: 3.5 mmol/L (ref 3.5–5.1)
Sodium: 137 mmol/L (ref 135–145)

## 2015-03-07 LAB — URINALYSIS, ROUTINE W REFLEX MICROSCOPIC
Bilirubin Urine: NEGATIVE
Glucose, UA: NEGATIVE mg/dL
Hgb urine dipstick: NEGATIVE
Ketones, ur: NEGATIVE mg/dL
Leukocytes, UA: NEGATIVE
Nitrite: NEGATIVE
Protein, ur: NEGATIVE mg/dL
Specific Gravity, Urine: 1.015 (ref 1.005–1.030)
Urobilinogen, UA: 0.2 mg/dL (ref 0.0–1.0)
pH: 6 (ref 5.0–8.0)

## 2015-03-07 LAB — CBC WITH DIFFERENTIAL/PLATELET
Basophils Absolute: 0 10*3/uL (ref 0.0–0.1)
Basophils Relative: 0 % (ref 0–1)
Eosinophils Absolute: 0.3 10*3/uL (ref 0.0–0.7)
Eosinophils Relative: 4 % (ref 0–5)
HCT: 37 % (ref 36.0–46.0)
Hemoglobin: 12.7 g/dL (ref 12.0–15.0)
Lymphocytes Relative: 23 % (ref 12–46)
Lymphs Abs: 1.7 10*3/uL (ref 0.7–4.0)
MCH: 30.6 pg (ref 26.0–34.0)
MCHC: 34.3 g/dL (ref 30.0–36.0)
MCV: 89.2 fL (ref 78.0–100.0)
Monocytes Absolute: 0.3 10*3/uL (ref 0.1–1.0)
Monocytes Relative: 4 % (ref 3–12)
Neutro Abs: 5.1 10*3/uL (ref 1.7–7.7)
Neutrophils Relative %: 69 % (ref 43–77)
Platelets: 218 10*3/uL (ref 150–400)
RBC: 4.15 MIL/uL (ref 3.87–5.11)
RDW: 13.3 % (ref 11.5–15.5)
WBC: 7.4 10*3/uL (ref 4.0–10.5)

## 2015-03-07 MED ORDER — SODIUM CHLORIDE 0.9 % IV BOLUS (SEPSIS)
1000.0000 mL | Freq: Once | INTRAVENOUS | Status: AC
  Administered 2015-03-07: 1000 mL via INTRAVENOUS

## 2015-03-07 NOTE — ED Provider Notes (Signed)
CSN: [id number]     Arrival date & time 03/07/15  1321 History   First MD Initiated Contact with Patient 03/07/15 1329     Chief Complaint  Patient presents with  . Nasal Congestion    3 week hx of sinus congestion  . Dizziness    felt dizzy in church  . Fall    fell, struck r/arm  . Headache   HPI   69 YOF presents to the ED with complaints of congestion, dizziness,  and syncope. Patient reports that for the last 3 weeks she's had nasal congestion, worse when inside with air conditioning on, better outside. She reports that today at church she was feeling hot and anxious, stood up to go outside felt dizzy and passed out in the parking lot. Patient reports she fell back and hit her head. Patient reports she remembers all events leading up to the syncope, and events immediately after. Bystanders report no seizure-like activity, no postictal state. Patient reports that prior to the passing out she was feeling hot, reports her heart was racing, but denies chest pain, shortness of breath, abdominal pain, headache.    Past Medical History  Diagnosis Date  . Aortic heart murmur on examination 08/19/2012    No previous Echo   . Elevated liver enzymes 08/19/2012   Past Surgical History  Procedure Laterality Date  . Cholecystectomy     History reviewed. No pertinent family history. History  Substance Use Topics  . Smoking status: Never Smoker   . Smokeless tobacco: Not on file  . Alcohol Use: No   OB History    Gravida Para Term Preterm AB TAB SAB Ectopic Multiple Living   1              Review of Systems  All other systems reviewed and are negative.   Allergies  Ciprofloxacin  Home Medications   Prior to Admission medications   Medication Sig Start Date End Date Taking? Authorizing Provider  Prenatal Vit-Fe Fumarate-FA (PRENATAL MULTIVITAMIN) TABS tablet Take 1 tablet by mouth daily at 12 noon.   Yes Historical Provider, MD  acetaminophen (TYLENOL) 325 MG tablet Take 2  tablets (650 mg total) by mouth every 6 (six) hours as needed (or Fever >/= 101). Patient not taking: Reported on 10/12/2014 08/19/12   Jessee Avers, MD  ibuprofen (MOTRIN IB) 200 MG tablet Take 2 tablets (400 mg total) by mouth every 6 (six) hours as needed for headache. Patient not taking: Reported on 10/12/2014 08/20/12   Jessee Avers, MD  ondansetron (ZOFRAN) 8 MG tablet Take 1 tablet (8 mg total) by mouth every 8 (eight) hours as needed for nausea. Patient not taking: Reported on 10/12/2014 08/19/12   Jessee Avers, MD  potassium chloride SA (K-DUR,KLOR-CON) 20 MEQ tablet Take 1 tablet (20 mEq total) by mouth 2 (two) times daily. Patient not taking: Reported on 10/12/2014 08/20/12   Jessee Avers, MD   BP 116/67 mmHg  Pulse 72  Temp(Src) 97.5 F (36.4 C) (Oral)  Resp 18  SpO2 98%  LMP 11/10/2014   Physical Exam  Constitutional: She is oriented to person, place, and time. She appears well-developed and well-nourished.  HENT:  Head: Normocephalic and atraumatic.  Eyes: Pupils are equal, round, and reactive to light.  Neck: Normal range of motion. Neck supple. No JVD present. No tracheal deviation present. No thyromegaly present.  Cardiovascular: Normal rate, regular rhythm, normal heart sounds and intact distal pulses.  Exam reveals no gallop and no friction rub.  No murmur heard. Pulmonary/Chest: Effort normal and breath sounds normal. No stridor. No respiratory distress. She has no wheezes. She has no rales. She exhibits no tenderness.  Abdominal:  Gravid abdomen nontender to palpation  Musculoskeletal: Normal range of motion.  Bilateral lower extremities equal no signs of swelling or edema. Nontender to palpation  Lymphadenopathy:    She has no cervical adenopathy.  Neurological: She is alert and oriented to person, place, and time. She has normal strength. No cranial nerve deficit or sensory deficit. Coordination normal. GCS eye subscore is 4. GCS verbal subscore is 5. GCS  motor subscore is 6.  Skin: Skin is warm and dry.  Psychiatric: She has a normal mood and affect. Her behavior is normal. Judgment and thought content normal.  Nursing note and vitals reviewed.   ED Course  Procedures (including critical care time) Labs Review Labs Reviewed  BASIC METABOLIC PANEL - Abnormal; Notable for the following:    BUN <5 (*)    Calcium 8.6 (*)    All other components within normal limits  CBC WITH DIFFERENTIAL/PLATELET  URINALYSIS, ROUTINE W REFLEX MICROSCOPIC (NOT AT [facility])    Imaging Review No results found.   EKG Interpretation   Date/Time:  Sunday March 07 2015 15:40:06 EDT Ventricular Rate:  71 PR Interval:  171 QRS Duration: 85 QT Interval:  396 QTC Calculation: 430 R Axis:   70 Text Interpretation:  Sinus rhythm Normal ECG No significant change since  last tracing Confirmed by Maryan Rued  MD, Loree Fee ([id number]) on 03/07/2015  5:24:38 PM      MDM   Final diagnoses:  Syncope, unspecified syncope type  Vasomotor rhinitis    Labs: CBC, BMP, Urinalysis- no significant findings   Imaging: Fetal heart tones done by Blanchie Dessert M.D.- normal  Consults:  Therapeutics: NS  Assessment:syncope  Plan: Pt presents with nasal congestion, pregnancy, syncope. Patient was asymptomatic during her stay in the ED, was not dizzy, or static vital signs normal, labs reassuring, no significant findings. Patient has no chest pain, shortness of breath, vital signs reassuring, unlikely cardiac pulmonary related. Today's episode, likely represent orthostatic hypotension. Patient's nasal congestion likely represents allergic rhinitis, she is instructed to use Benadryl as needed for symptoms. Fetal heart tones normal. Patient is instructed to drink plenty fluids, maintain adequate food intake, and follow up with her primary care provider in 3 days for reevaluation. Patient has an OB/GYN appointment on July 7, she is encouraged to make this appointment. Strict return  precautions given, patient verbalizes understanding and agreement to today's plan and had no further questions or concerns at time of discharge.      Okey Regal, PA-C 03/07/15 1852  Blanchie Dessert, MD 03/07/15 2150

## 2015-03-07 NOTE — Discharge Instructions (Signed)
Please follow-up with PCP in 2-3 days for re-evaluation. Monitor for new or worsening signs or symptoms return immediately if any present. Please follow-up with OB/GYN as previously scheduled.

## 2015-03-07 NOTE — ED Notes (Signed)
Pt states that she ate a small breakfast at 0930 this morning before church, and then towards the end of the service she started feeling lightheaded, hot, and nauseated.  Then she fell in the parking lot.  She denies hx of DM or hypotension but reports that she is currently [redacted] weeks pregnant.  She is due November 15th and is currently taking prenatal vitamins.  She is up to date with prenatal care and has another prenatal visit scheduled for July 7. She reports pain as 6/10 in right arm and on the back of her head but states that she did not hit her belly when she landed.

## 2015-03-07 NOTE — ED Notes (Signed)
Pt given crackers and peanut butter, ginger ale, and a warm blanket per request.  No acute distress noted and no further needs expressed at this time.

## 2015-03-07 NOTE — ED Notes (Addendum)
Pt reports that she felt lightheaded while in church. She walked outside and felt very dizzy and reports LOC. Husband stated that she landed on r/arm. No scratches or injuries noted. Denies striking head.  Pt stated that she has a headache and facial pressure x 2 weeks.  Pt added that she feels tender on the back of her head and might have struck it on the pavement, not swelling noted

## 2015-03-11 LAB — OB RESULTS CONSOLE GC/CHLAMYDIA
Chlamydia: NEGATIVE
GC PROBE AMP, GENITAL: NEGATIVE

## 2015-03-11 LAB — OB RESULTS CONSOLE RUBELLA ANTIBODY, IGM: Rubella: IMMUNE

## 2015-03-11 LAB — OB RESULTS CONSOLE HEPATITIS B SURFACE ANTIGEN: Hepatitis B Surface Ag: NEGATIVE

## 2015-03-11 LAB — OB RESULTS CONSOLE HGB/HCT, BLOOD
HCT: 39 %
Hemoglobin: 12.2 g/dL

## 2015-03-11 LAB — OB RESULTS CONSOLE RPR: RPR: NONREACTIVE

## 2015-03-11 LAB — OB RESULTS CONSOLE ANTIBODY SCREEN: ANTIBODY SCREEN: NEGATIVE

## 2015-03-11 LAB — OB RESULTS CONSOLE HIV ANTIBODY (ROUTINE TESTING): HIV: NONREACTIVE

## 2015-03-11 LAB — OB RESULTS CONSOLE PLATELET COUNT: PLATELETS: 182 10*3/uL

## 2015-03-15 LAB — GLUCOSE, 3 HOUR GESTATIONAL
GLUCOSE 3 HOUR GTT: 130 mg/dL (ref ?–140)
Glucose, Fasting: 97 mg/dL (ref 60–109)
Glucose, GTT - 1 Hour: 109 mg/dL (ref ?–200)
Glucose, GTT - 2 Hour: 162 mg/dL — AB (ref ?–140)

## 2015-03-16 ENCOUNTER — Inpatient Hospital Stay (HOSPITAL_COMMUNITY)
Admission: AD | Admit: 2015-03-16 | Discharge: 2015-03-16 | Disposition: A | Discharge status: Home / Self Care | Payer: Self-pay | Source: Ambulatory Visit | Attending: Obstetrics and Gynecology | Admitting: Obstetrics and Gynecology

## 2015-03-16 ENCOUNTER — Encounter (HOSPITAL_COMMUNITY): Payer: Self-pay | Admitting: *Deleted

## 2015-03-16 DIAGNOSIS — G5601 Carpal tunnel syndrome, right upper limb: Secondary | ICD-10-CM | POA: Insufficient documentation

## 2015-03-16 DIAGNOSIS — Z3A18 18 weeks gestation of pregnancy: Secondary | ICD-10-CM | POA: Insufficient documentation

## 2015-03-16 DIAGNOSIS — J3089 Other allergic rhinitis: Secondary | ICD-10-CM | POA: Insufficient documentation

## 2015-03-16 DIAGNOSIS — R0981 Nasal congestion: Secondary | ICD-10-CM | POA: Insufficient documentation

## 2015-03-16 DIAGNOSIS — O9989 Other specified diseases and conditions complicating pregnancy, childbirth and the puerperium: Secondary | ICD-10-CM | POA: Insufficient documentation

## 2015-03-16 MED ORDER — FLUTICASONE PROPIONATE 50 MCG/ACT NA SUSP
2.0000 | Freq: Once | NASAL | Status: AC
  Administered 2015-03-16: 2 via NASAL
  Filled 2015-03-16: qty 16

## 2015-03-16 MED ORDER — CETIRIZINE HCL 10 MG PO TABS: 10.0000 mg | ORAL_TABLET | Freq: Every day | ORAL | Status: DC

## 2015-03-16 MED ORDER — LORATADINE 10 MG PO TABS
10.0000 mg | ORAL_TABLET | Freq: Once | ORAL | Status: AC
  Administered 2015-03-16: 10 mg via ORAL
  Filled 2015-03-16: qty 1

## 2015-03-16 MED ORDER — FLUTICASONE PROPIONATE 50 MCG/ACT NA SUSP: 2.0000 | Freq: Two times a day (BID) | NASAL | Status: DC

## 2015-03-16 NOTE — Progress Notes (Signed)
History     CSN: [id number]  Arrival date & time 03/16/15  3762   First Provider Initiated Contact with Patient 03/16/15 1117      No chief complaint on file.   HPI: [name] is a [id number], that is [redacted]w[redacted]d by LMP of 11/10/2014, presents to the MAU for allergies of 3 week duration. Patient complaints of stuffy nose, itchy eyes and throat that is present when she is indoors and the air conditioner is on. She has had the filters replaced and has had the A/C unit cleaned but has noticed no changes in her symptoms. Upon being outside, her stuffy nose clears up and she feels better. She has tried a nasal spray and Benadyrl both of which help temporarily but don't complete get rid of the stuffy nose and itchy eyes. Eating jalapenos and drinking water seems to help clear her stuffy nose.  Denies any history of asthma in herself or family, and says that she has not had allergies in the past. Denies any difficulty breathing, fever,cough or sputum production. Past Medical History  Diagnosis Date  . Aortic heart murmur on examination 08/19/2012    No previous Echo   . Elevated liver enzymes 08/19/2012    Past Surgical History  Procedure Laterality Date  . Cholecystectomy      History reviewed. No pertinent family history.  History  Substance Use Topics  . Smoking status: Never Smoker   . Smokeless tobacco: Not on file  . Alcohol Use: No    OB History    Gravida Para Term Preterm AB TAB SAB Ectopic Multiple Living   9 6 5 1 2  2   5       Review of Systems: Denies any nausea, vomiting, fever, diaphoresis, headaches, facial swelling, eye pain, postnasal drip, rhinorrhea. Endorses nasal congestion, itchy eyes/watery eyes.  Allergies  Ciprofloxacin  Home Medications   Current Outpatient Rx  Name  Route  Sig  Dispense  Refill  . Prenatal Vit-Fe Fumarate-FA (PRENATAL MULTIVITAMIN) TABS tablet   Oral   Take 1 tablet by mouth daily at 12 noon.         . cetirizine (ZYRTEC) 10 MG  tablet   Oral   Take 1 tablet (10 mg total) by mouth daily.   30 tablet   3   . fluticasone (FLONASE ALLERGY RELIEF) 50 MCG/ACT nasal spray   Each Nare   Place 2 sprays into both nostrils 2 (two) times daily.   16 g   2     BP 112/75 mmHg  Pulse 63  Temp(Src) 98.2 F (36.8 C) (Axillary)  Resp 20  SpO2 100%  LMP 11/10/2014  Physical Exam   Lungs: Clear bilaterally to ausculation, no peripheral edema Heart: Normal regular, rhythm, no murmurs, rubs or gallops Head: Normocephalic and atraumatic, there was some tenderness to palpation at the frontal and maxillary sinuses. Eyes: EOMI, no conjuctivitis Throat: No cervical lymphadenopathy noted  MAU Course  Procedures (including critical care time)  Labs Reviewed - No data to display No results found.   1. Other allergic rhinitis   2. Nasal congestion related to pregnancy   3. Carpal tunnel syndrome of right wrist       MDM  A: [name] is a [id number], that is [redacted]w[redacted]d by LMP of 11/10/2014 that presents today with nasal congestion, tenderness at the maxillary/ frontal sinuses, itchy eyes and throat that are more consistent with allergic rhinitis.  P: A course of allergy medicine like Zyrtec during  the day may help alleviate some of the allergy symptoms she is experiencing and then at night she should take Benadryl without concern of sedation.

## 2015-03-16 NOTE — MAU Provider Note (Signed)
History     CSN: [id number]  Arrival date & time 03/16/15 0258  First Provider Initiated Contact with Patient 03/16/15 1117   CC: N/V  HPI: Ms. Sula Soda is a [id number], that is [redacted]w[redacted]d by LMP of 11/10/2014, presents to the MAU for allergies of 3 week duration. Patient complaints of stuffy nose, itchy eyes and throat that is present when she is indoors and the air conditioner is on. She has had the filters replaced and has had the A/C unit cleaned but has noticed no changes in her symptoms. Upon being outside, her stuffy nose clears up and she feels better. She has tried a nasal spray and Benadyrl both of which help temporarily but don't complete get rid of the stuffy nose and itchy eyes. Eating jalapenos and drinking water seems to help clear her stuffy nose.  Denies any history of asthma in herself or family, and says that she has not had allergies in the past. Denies any difficulty breathing, fever,cough or sputum production.  Also reports three episodes of short, sharp substernal pains last a few second over the past three nights. waking w/ pain and numbness in right hand.   Past Medical History  Diagnosis Date  . Aortic heart murmur on examination 08/19/2012    Echo showed mild tricuspid regurgitation  . Elevated liver enzymes 08/19/2012    Past Surgical History  Procedure Laterality Date  . Cholecystectomy      History reviewed. No pertinent family history.  History  Substance Use Topics  . Smoking status: Never Smoker   . Smokeless tobacco: Not on file  . Alcohol Use: No    OB History    Gravida Para Term Preterm AB TAB SAB Ectopic Multiple Living   9 6 5 1 2  2   5       Review of Systems: Denies any nausea, vomiting, fever, SOB, diaphoresis, headaches, facial swelling, eye pain, postnasal drip, rhinorrhea. Endorses nasal congestion, itchy eyes/watery eyes.  Allergies  Ciprofloxacin  Home  Medications   Current Outpatient Rx  Name  Route  Sig  Dispense  Refill  . Prenatal Vit-Fe Fumarate-FA (PRENATAL MULTIVITAMIN) TABS tablet   Oral   Take 1 tablet by mouth daily at 12 noon.         . cetirizine (ZYRTEC) 10 MG tablet   Oral   Take 1 tablet (10 mg total) by mouth daily.   30 tablet   3   . fluticasone (FLONASE ALLERGY RELIEF) 50 MCG/ACT nasal spray   Each Nare   Place 2 sprays into both nostrils 2 (two) times daily.   16 g   2     BP 112/75 mmHg  Pulse 63  Temp(Src) 98.2 F (36.8 C) (Axillary)  Resp 20  SpO2 100%  LMP 11/10/2014  Physical Exam   Lungs: Clear bilaterally to ausculation, no peripheral edema Heart: Normal regular, rhythm, no murmurs, rubs or gallops Head: Normocephalic and atraumatic, there was some tenderness to palpation at the frontal and maxillary sinuses. Eyes: EOMI, no conjuctivitis Throat: No cervical lymphadenopathy noted Extremities: Normal strength and sensation in arms bilat.  FHR 140 by doppler  MAU Course  Procedures (including critical care time)  Labs Reviewed - No data to display  Imaging Results (Last 48 hours)    No results found.     1. Other allergic rhinitis   2. Nasal congestion related to pregnancy   3. Carpal tunnel syndrome of right wrist    MDM  A: Ms. Sula Soda  is a [id number], that is [redacted]w[redacted]d by LMP of 11/10/2014 that presents today with nasal congestion, tenderness at the maxillary/ frontal sinuses, itchy eyes and throat that are more consistent with allergic rhinitis.  P: A course of allergy medicine like Zyrtec during the day may help alleviate some of the allergy symptoms she is experiencing and then at night she should take Benadryl without concern of sedation.   Follow-up Information    Follow up with [facility].   Specialty:  Obstetrics and Gynecology   Why:  Routine prenatal visit   Contact information:   [place] [phone]      Follow up with [facility].   Why:  As needed in emergencies   Contact information:   [address] [id number] [place] [phone]      Follow up with Primary care provider .   Why:  for ongoing problems with allergies.        Medication List    STOP taking these medications        acetaminophen 325 MG tablet  Commonly known as:  TYLENOL     ibuprofen 200 MG tablet  Commonly known as:  MOTRIN IB     ondansetron 8 MG tablet  Commonly known as:  ZOFRAN     potassium chloride SA 20 MEQ tablet  Commonly known as:  K-DUR,KLOR-CON      TAKE these medications        cetirizine 10 MG tablet  Commonly known as:  ZYRTEC  Take 1 tablet (10 mg total) by mouth daily.     fluticasone 50 MCG/ACT nasal spray  Commonly known as:  FLONASE ALLERGY RELIEF  Place 2 sprays into both nostrils 2 (two) times daily.     prenatal multivitamin Tabs tablet  Take 1 tablet by mouth daily at 12 noon.       [place], [place] 03/16/2015 5:47 PM

## 2015-03-16 NOTE — MAU Note (Signed)
Pt was unable to sleep last night, has allergies due to air conditioning since she is pregnant.  Feels like chest is tight, feels like heart is racing, feels like she can't breathe.  Throat is dry.  Denies abdominal pain or bleeding

## 2015-03-16 NOTE — Discharge Instructions (Signed)
Rinitis alrgica (Allergic Rhinitis) La rinitis alrgica ocurre cuando las membranas mucosas de la nariz responden a los alrgenos. Los alrgenos son las partculas que estn en el aire y que hacen que el cuerpo tenga una reaccin IT consultant. Esto hace que usted libere anticuerpos alrgicos. A travs de una cadena de eventos, estos finalmente hacen que usted libere histamina en la corriente sangunea. Aunque la funcin de la histamina es proteger al organismo, es esta liberacin de histamina lo que provoca malestar, como los estornudos frecuentes, la congestin y goteo y Engineer, petroleum.  CAUSAS  La causa de la rinitis Regulatory affairs officer (fiebre del heno) son los alrgenos del polen que pueden provenir del csped, los rboles y Human resources officer. La causa de la rinitis Stage manager (rinitis alrgica perenne) son los alrgenos como los caros del polvo domstico, la caspa de las mascotas y las esporas del moho.  SNTOMAS   Secrecin nasal (congestin).  Goteo y picazn nasales con estornudos y Industrial/product designer. DIAGNSTICO  Su mdico puede ayudarlo a Actor alrgeno o los alrgenos que desencadenan sus sntomas. Si usted y su mdico no pueden Teacher, adult education cul es el alrgeno, pueden hacerse anlisis de sangre o estudios de la piel. TRATAMIENTO  La rinitis alrgica no tiene [place], pero puede controlarse mediante lo siguiente:  Medicamentos y vacunas contra la alergia (inmunoterapia).  Prevencin del alrgeno. La fiebre del heno a menudo puede tratarse con antihistamnicos en las formas de pldoras o aerosol nasal. Los antihistamnicos bloquean los efectos de la histamina. Existen medicamentos de venta libre que pueden ayudar con la congestin nasal y la hinchazn alrededor de los ojos. Consulte a su mdico antes de tomar o administrarse este medicamento.  Si la prevencin del alrgeno o el medicamento recetado no dan resultado, existen muchos medicamentos nuevos que su mdico puede recetarle. Pueden  usarse medicamentos ms fuertes si las medidas iniciales no son efectivas. Pueden aplicarse inyecciones desensibilizantes si los medicamentos y la prevencin no funcionan. La desensibilizacin ocurre cuando un paciente recibe vacunas constantes hasta que el cuerpo se vuelve menos sensible al alrgeno. Asegrese de Chartered certified accountant seguimiento con su mdico si los problemas continan. INSTRUCCIONES PARA EL CUIDADO EN EL HOGAR No es posible evitar por completo los alrgenos, pero puede reducir los sntomas al tomar medidas para limitar su exposicin a ellos. Es muy til saber exactamente a qu es alrgico para que pueda evitar sus desencadenantes especficos. SOLICITE ATENCIN MDICA SI:   Misty Gamble.  Desarrolla una tos que no se detiene fcilmente (persistente).  Le falta el aire.  Comienza a tener sibilancias.  Los sntomas interfieren con las actividades diarias normales. Document Released: 05/31/2005 Document Revised: 06/11/2013 [facility] Patient Information 2015 [place]. This information is not intended to replace advice given to you by your health care provider. Make sure you discuss any questions you have with your health care provider.

## 2015-03-18 ENCOUNTER — Ambulatory Visit (HOSPITAL_COMMUNITY): Admission: RE | Admit: 2015-03-18 | Payer: Self-pay | Source: Ambulatory Visit

## 2015-03-18 ENCOUNTER — Other Ambulatory Visit (HOSPITAL_COMMUNITY): Payer: Self-pay | Admitting: Nurse Practitioner

## 2015-03-18 DIAGNOSIS — Z8759 Personal history of other complications of pregnancy, childbirth and the puerperium: Secondary | ICD-10-CM

## 2015-03-18 DIAGNOSIS — O09522 Supervision of elderly multigravida, second trimester: Secondary | ICD-10-CM

## 2015-03-26 ENCOUNTER — Ambulatory Visit (HOSPITAL_COMMUNITY)
Admission: RE | Admit: 2015-03-26 | Discharge: 2015-03-26 | Disposition: A | Discharge status: Home / Self Care | Payer: Self-pay | Source: Ambulatory Visit | Attending: Nurse Practitioner | Admitting: Nurse Practitioner

## 2015-03-26 ENCOUNTER — Encounter (HOSPITAL_COMMUNITY): Payer: Self-pay

## 2015-03-26 DIAGNOSIS — O09299 Supervision of pregnancy with other poor reproductive or obstetric history, unspecified trimester: Secondary | ICD-10-CM | POA: Insufficient documentation

## 2015-03-26 DIAGNOSIS — O09529 Supervision of elderly multigravida, unspecified trimester: Secondary | ICD-10-CM | POA: Insufficient documentation

## 2015-03-26 DIAGNOSIS — O09292 Supervision of pregnancy with other poor reproductive or obstetric history, second trimester: Secondary | ICD-10-CM | POA: Insufficient documentation

## 2015-03-26 DIAGNOSIS — Z8759 Personal history of other complications of pregnancy, childbirth and the puerperium: Secondary | ICD-10-CM

## 2015-03-26 DIAGNOSIS — Z315 Encounter for genetic counseling: Secondary | ICD-10-CM | POA: Insufficient documentation

## 2015-03-26 DIAGNOSIS — Z3689 Encounter for other specified antenatal screening: Secondary | ICD-10-CM | POA: Insufficient documentation

## 2015-03-26 DIAGNOSIS — O09522 Supervision of elderly multigravida, second trimester: Secondary | ICD-10-CM | POA: Insufficient documentation

## 2015-03-26 DIAGNOSIS — Z3A19 19 weeks gestation of pregnancy: Secondary | ICD-10-CM | POA: Insufficient documentation

## 2015-03-26 NOTE — Progress Notes (Signed)
Genetic Counseling  High-Risk Gestation Note  Appointment Date:  03/26/2015 Referred By: Misty Bloom, NP Date of Birth:  September 04, 1976 Gamble:  Misty Gamble   Pregnancy History: [id number] Estimated Date of Delivery: 08/17/15 Estimated Gestational Age: [redacted]w[redacted]d Attending: Renella Cunas, MD   [name], Misty Gamble, were seen for genetic counseling because of a maternal age of 39 y.o..     In Summary:  Advanced maternal age and associated risk for fetal aneuploidy  Quad screen within normal limits (1 in 712 Down syndrome risk and 1 in 2298 T18 risk)  Detailed ultrasound today; visualized fetal anatomy normal  Patient declined NIPS and amniocentesis  Follow-up ultrasound scheduled for 05/21/15 for growth given maternal age and history of fetal loss  They were counseled regarding maternal age and the association with risk for chromosome conditions due to nondisjunction with aging of the ova.   We reviewed chromosomes, nondisjunction, and the associated 1 in 52 risk for fetal aneuploidy related to a maternal age of 39 y.o. at [redacted]w[redacted]d gestation.  They were counseled that the risk for aneuploidy decreases as gestational age increases, accounting for those pregnancies which spontaneously abort.  We specifically discussed Down syndrome (trisomy 66), trisomies 59 and 101, and sex chromosome aneuploidies (47,XXX and 47,XXY) including the common features and prognoses of each.   [name] previously had Quad screening through [facility], which was within normal range for the conditions screened. We reviewed that this screen reduced the risks for fetal Down syndrome (1 in 88 to 1 in 712), trisomy 18 (1 in 265 to 1 in 2,298), and open neural tube defects. They were counseled the screening tests modify a patient's a priori risk for these conditions to provide a pregnancy specific risk assessment but is not diagnostic for these  conditions.   Detailed ultrasound was performed today. Visualized fetal anatomy appeared normal. Complete ultrasound results reported separately. We reviewed the benefits and limitations of ultrasound as a screening tool for fetal aneuploidy and that ultrasound cannot diagnose or rule out chromosome conditions or all birth defects.   We reviewed additional available screening option of noninvasive prenatal screening (NIPS)/cell free DNA (cfDNA) testing.  They were counseled regarding the benefits and limitations. Specifically, we discussed the conditions for which each test screens, the detection rates, and false positive rates of each. They were also counseled regarding diagnostic testing via amniocentesis. We reviewed the approximate 1 in 616-073 risk for complications for amniocentesis, including spontaneous pregnancy loss. After consideration of all the options, they declined NIPS and amniocentesis, stating that they were comfortable with the assessment provided by Quad screen and by ultrasound. They understand that screening tests cannot rule out all birth defects or genetic syndromes. The patient was advised of this limitation and states she still does not want additional testing at this time.   [name] was provided with written information regarding cystic fibrosis (CF) including the carrier frequency and incidence in the Hispanic population, the availability of carrier testing and prenatal diagnosis if indicated.  In addition, we discussed that CF is routinely screened for as part of the [place] newborn screening panel.  She declined additional discussion today. This is available to her if not previously performed and desired by the patient.   Both family histories were reviewed and found to be contributory for possible intellectual disability for the patient's younger brother. He was reportedly the 10th child for her parents, and the patient  reports that he was possibly switched at birth.  [place], and no records are available regarding this individual. He is currently in his 20's and the description fits with possible mild delays. He does not have physical health problems and reportedly is otherwise healthy. No additional relatives were reported with similar features. [name] was counseled that there are many different causes of intellectual disabilities including environmental, multifactorial, and genetic etiologies.  We discussed that a specific diagnosis for intellectual disability can be determined in approximately 50% of these individuals.  In the remaining 50% of individuals, a diagnosis may never be determined.  We discussed that without more specific information, it is difficult to provide an accurate risk assessment.  Further genetic counseling is warranted if more information is obtained.  [name] denied exposure to environmental toxins or chemical agents. She denied the use of alcohol, tobacco or street drugs. She denied significant viral illnesses during the course of her pregnancy. Her medical and surgical histories were contributory for history of a miscarriage of a twin gestation and a 30 week demise previously.   I counseled this couple regarding the above risks and available options.  The approximate face-to-face time with the genetic counselor was 30 minutes.  Misty Oman, MS,  Certified [employer] 03/26/2015

## 2015-03-30 ENCOUNTER — Ambulatory Visit (INDEPENDENT_AMBULATORY_CARE_PROVIDER_SITE_OTHER): Payer: Self-pay | Admitting: Obstetrics & Gynecology

## 2015-03-30 ENCOUNTER — Encounter: Payer: Self-pay | Admitting: Obstetrics & Gynecology

## 2015-03-30 VITALS — BP 108/75 | HR 73 | Temp 98.1°F | Wt 145.1 lb

## 2015-03-30 DIAGNOSIS — R1900 Intra-abdominal and pelvic swelling, mass and lump, unspecified site: Secondary | ICD-10-CM

## 2015-03-30 DIAGNOSIS — Z8759 Personal history of other complications of pregnancy, childbirth and the puerperium: Secondary | ICD-10-CM

## 2015-03-30 DIAGNOSIS — R748 Abnormal levels of other serum enzymes: Secondary | ICD-10-CM

## 2015-03-30 DIAGNOSIS — O0990 Supervision of high risk pregnancy, unspecified, unspecified trimester: Secondary | ICD-10-CM | POA: Insufficient documentation

## 2015-03-30 LAB — POCT URINALYSIS DIP (DEVICE)
Bilirubin Urine: NEGATIVE
Glucose, UA: NEGATIVE mg/dL
Hgb urine dipstick: NEGATIVE
Ketones, ur: NEGATIVE mg/dL
Leukocytes, UA: NEGATIVE
Nitrite: NEGATIVE
Protein, ur: NEGATIVE mg/dL
SPECIFIC GRAVITY, URINE: 1.025 (ref 1.005–1.030)
Urobilinogen, UA: 0.2 mg/dL (ref 0.0–1.0)
pH: 5.5 (ref 5.0–8.0)

## 2015-03-30 NOTE — Progress Notes (Signed)
  Subjective:    Misty Gamble is being seen today for her first obstetrical visit.  This is a planned pregnancy. She is at [redacted]w[redacted]d gestation. Her obstetrical history is significant for advanced maternal age and h/o IUFD. Relationship with FOB: spouse, living together. Patient does intend to breast feed. Pregnancy history fully reviewed.  Patient reports rash on left buttocks and left leg.  she is using vinegar on it and it is improved.d..  Review of Systems:   Review of Systems  Objective:     BP 108/75 mmHg  Pulse 73  Temp(Src) 98.1 F (36.7 C)  Wt 145 lb 1.6 oz (65.817 kg)  LMP 11/10/2014 Physical Exam  Exam Pt in NAD Abd: gravid, NT, ND FHT's: 140's      Assessment:    Pregnancy: [id number] Patient Active Problem List   Diagnosis Date Noted  . Supervision of high risk pregnancy, antepartum 03/30/2015  . Advanced maternal age in multigravida 03/26/2015  . AMA (advanced maternal age) multigravida 97+   . History of fetal loss   . Aortic heart murmur on examination 08/19/2012  . Elevated liver enzymes 08/19/2012  . Gastroenteritis 08/18/2012       Plan:     Initial labs drawn. Prenatal vitamins. Problem list reviewed and updated. AFP3 discussed: requested. Role of ultrasound in pregnancy discussed; fetal survey: requested. Amniocentesis discussed: not indicated. Follow up in 4 weeks. 60% of 30 min visit spent on counseling and coordination of care.     HARRAWAY-SMITH, Hulan Szumski 03/30/2015

## 2015-03-30 NOTE — Progress Notes (Signed)
Here for first visit. C/o maybe a bug bite on lower back, c/o itching. Given prenatal education booklets.

## 2015-03-31 ENCOUNTER — Encounter: Payer: Self-pay | Admitting: *Deleted

## 2015-04-01 ENCOUNTER — Other Ambulatory Visit (HOSPITAL_COMMUNITY): Payer: Self-pay | Admitting: Obstetrics & Gynecology

## 2015-04-02 ENCOUNTER — Encounter: Payer: Self-pay | Admitting: *Deleted

## 2015-04-06 LAB — AFP, QUAD SCREEN
AFP: 83.8 ng/mL
Curr Gest Age: 20 wks.days
Down Syndrome Scr Risk Est: 1:1290 {titer}
HCG, Total: 14.76 IU/mL
INH: 232.7 pg/mL
Interpretation-AFP: NEGATIVE
MOM FOR INH: 1.58
MoM for AFP: 1.46
MoM for hCG: 0.77
OPEN SPINA BIFIDA: NEGATIVE
Osb Risk: 1:3120 {titer}
Tri 18 Scr Risk Est: NEGATIVE
Trisomy 18 (Edward) Syndrome Interp.: 1:2150 {titer}
uE3 Mom: 0.8
uE3 Value: 1.86 ng/mL

## 2015-04-08 ENCOUNTER — Ambulatory Visit (HOSPITAL_COMMUNITY)
Admission: RE | Admit: 2015-04-08 | Discharge: 2015-04-08 | Disposition: A | Discharge status: Home / Self Care | Payer: Self-pay | Source: Ambulatory Visit | Attending: Obstetrics & Gynecology | Admitting: Obstetrics & Gynecology

## 2015-04-08 DIAGNOSIS — R102 Pelvic and perineal pain: Secondary | ICD-10-CM | POA: Insufficient documentation

## 2015-04-08 DIAGNOSIS — R1900 Intra-abdominal and pelvic swelling, mass and lump, unspecified site: Secondary | ICD-10-CM

## 2015-04-08 DIAGNOSIS — O9989 Other specified diseases and conditions complicating pregnancy, childbirth and the puerperium: Secondary | ICD-10-CM | POA: Insufficient documentation

## 2015-04-08 DIAGNOSIS — Z9049 Acquired absence of other specified parts of digestive tract: Secondary | ICD-10-CM | POA: Insufficient documentation

## 2015-04-08 DIAGNOSIS — Z3A21 21 weeks gestation of pregnancy: Secondary | ICD-10-CM | POA: Insufficient documentation

## 2015-04-21 ENCOUNTER — Encounter: Payer: Self-pay | Admitting: *Deleted

## 2015-04-27 ENCOUNTER — Encounter: Payer: Self-pay | Admitting: Advanced Practice Midwife

## 2015-05-03 ENCOUNTER — Inpatient Hospital Stay (HOSPITAL_COMMUNITY)
Admission: AD | Admit: 2015-05-03 | Discharge: 2015-05-03 | Disposition: A | Discharge status: Home / Self Care | Payer: Self-pay | Source: Ambulatory Visit | Attending: Family Medicine | Admitting: Family Medicine

## 2015-05-03 ENCOUNTER — Other Ambulatory Visit (HOSPITAL_COMMUNITY): Payer: Self-pay | Admitting: Nurse Practitioner

## 2015-05-03 ENCOUNTER — Encounter (HOSPITAL_COMMUNITY): Payer: Self-pay

## 2015-05-03 DIAGNOSIS — R21 Rash and other nonspecific skin eruption: Secondary | ICD-10-CM | POA: Insufficient documentation

## 2015-05-03 DIAGNOSIS — O09299 Supervision of pregnancy with other poor reproductive or obstetric history, unspecified trimester: Secondary | ICD-10-CM

## 2015-05-03 DIAGNOSIS — Z8632 Personal history of gestational diabetes: Secondary | ICD-10-CM

## 2015-05-03 DIAGNOSIS — O0992 Supervision of high risk pregnancy, unspecified, second trimester: Secondary | ICD-10-CM

## 2015-05-03 DIAGNOSIS — Z3A27 27 weeks gestation of pregnancy: Secondary | ICD-10-CM

## 2015-05-03 DIAGNOSIS — O09522 Supervision of elderly multigravida, second trimester: Secondary | ICD-10-CM

## 2015-05-03 DIAGNOSIS — Z3A24 24 weeks gestation of pregnancy: Secondary | ICD-10-CM | POA: Insufficient documentation

## 2015-05-03 DIAGNOSIS — O352XX0 Maternal care for (suspected) hereditary disease in fetus, not applicable or unspecified: Secondary | ICD-10-CM

## 2015-05-03 DIAGNOSIS — O26892 Other specified pregnancy related conditions, second trimester: Secondary | ICD-10-CM | POA: Insufficient documentation

## 2015-05-03 DIAGNOSIS — O09292 Supervision of pregnancy with other poor reproductive or obstetric history, second trimester: Secondary | ICD-10-CM

## 2015-05-03 HISTORY — DX: Gestational diabetes mellitus in pregnancy, unspecified control: O24.419

## 2015-05-03 MED ORDER — TRIAMCINOLONE ACETONIDE 0.025 % EX OINT: 1.0000 "application " | TOPICAL_OINTMENT | Freq: Two times a day (BID) | CUTANEOUS | Status: DC

## 2015-05-03 NOTE — Progress Notes (Signed)
Dr. Si Raider notified of pt in MAU for eval of bug bites. Will come see pt shortly.

## 2015-05-03 NOTE — Discharge Instructions (Signed)
Use un poco de la crema 2 veces el dia evitando tocar el ojo.

## 2015-05-03 NOTE — MAU Note (Addendum)
Pt states started itching Saturday. Partner has few bites also. Same s/s one month ago and MD told her it was probable flea bite. No abnormal vaginal discharge or bleeding. Bite marks on arms and trunk/back. None on legs.

## 2015-05-03 NOTE — MAU Provider Note (Signed)
MAU HISTORY AND PHYSICAL  Chief Complaint:  Rash   HANIAH PENNY is a 39 y.o.  [id number] with IUP at [redacted]w[redacted]d presenting for Rash   Began on Saturday. First time with this rash. Started with an itchy red spot beneath right breast, now with scattered red raised itchy rash on abdomen and two on face. None in groin or hands or feet or soles. No fever or chills. Feeling baby move. Thinks dog may have fleas. No contractions, bleeding, or leakage of fluid. Hasn't put anything on this rash. Husband has two spots on his abdomen that resembles the patient's rash.   Menstrual History: OB History    Gravida Para Term Preterm AB TAB SAB Ectopic Multiple Living   9 6 5 1 2  2   5       Patient's last menstrual period was 11/10/2014.      Past Medical History  Diagnosis Date  . Aortic heart murmur on examination 08/19/2012    No previous Echo   . Elevated liver enzymes 08/19/2012  . Gestational diabetes     Past Surgical History  Procedure Laterality Date  . Cholecystectomy      History reviewed. No pertinent family history.  Social History  Substance Use Topics  . Smoking status: Never Smoker   . Smokeless tobacco: Never Used  . Alcohol Use: No      Allergies  Allergen Reactions  . Ciprofloxacin Rash    Prescriptions prior to admission  Medication Sig Dispense Refill Last Dose  . cetirizine (ZYRTEC) 10 MG tablet Take 1 tablet (10 mg total) by mouth daily. 30 tablet 3 05/02/2015 at Unknown time  . Prenatal Vit-Fe Fumarate-FA (PRENATAL MULTIVITAMIN) TABS tablet Take 1 tablet by mouth daily at 12 noon.   05/02/2015 at Unknown time  . fluticasone (FLONASE ALLERGY RELIEF) 50 MCG/ACT nasal spray Place 2 sprays into both nostrils 2 (two) times daily. (Patient not taking: Reported on 05/03/2015) 16 g 2 Taking    Review of Systems - Negative except for what is mentioned in HPI.  Physical Exam  Blood pressure 105/60, pulse 73, temperature 97.6 F (36.4 C), temperature source Oral,  resp. rate 18, height 5' (1.524 m), weight 149 lb 9.6 oz (67.858 kg), last menstrual period 11/10/2014. GENERAL: Well-developed, well-nourished female in no acute distress.  LUNGS: Clear to auscultation bilaterally.  HEART: Regular rate and rhythm. ABDOMEN: Soft, nontender, nondistended, gravid.  EXTREMITIES: Nontender, no edema, 2+ distal pulses. Skin: scattered erythematous papules abdomen on two on face. FHT: 140/mod/+a/-d Contractions: toco quiet  Labs: No results found for this or any previous visit (from the past 24 hour(s)).  Imaging Studies:  US Abdomen Complete  04/08/2015   CLINICAL DATA:  Left-sided pain. Palpable area 1 year. Twenty-one weeks pregnant.  EXAM: ULTRASOUND ABDOMEN COMPLETE  COMPARISON:  OB exam 03/26/2015  FINDINGS: Gallbladder: Previous cholecystectomy.  Common bile duct: Diameter: 4.0 mm  Liver: No focal lesion identified. Within normal limits in parenchymal echogenicity.  IVC: No abnormality visualized.  Pancreas: Visualized portion unremarkable.  Spleen: Size and appearance within normal limits.  Right Kidney: Length: 12.0 cm. Echogenicity within normal limits. No mass or hydronephrosis visualized.  Left Kidney: Length: 11.0 cm. Echogenicity within normal limits. No mass or hydronephrosis visualized.  Abdominal aorta: 2.2 cm  Other findings: Ultrasound is performed in the area of patient's concern. No focal abnormality identified in this region.  IMPRESSION: 1.  No evidence for acute  abnormality. 2. Targeted evaluation of the area of patient's concern  is negative.   Electronically Signed   By: Nolon Nations M.D.   On: 04/08/2015 13:58    Assessment: MIKEYLA MUSIC is  39 y.o. [id number] at [redacted]w[redacted]d presents with rash. Has scattered erythematous papules on abdomen and two on face. No blisters. Not in a stria distribution. No constitional signs/symptoms, otherwise feeling well. If fleas as patient suspects I explained the treatment is to treat the pet. Not in a  typical distribution for scabies so holding on such treatment for now. Presence on abdomen raises question of PUPPS, though no stria distribution. The distrubtion on the inferior breast and where the breast touches the abdomen makes me think a contact dermatitis is also possible. Here well-appearing, with reactive NST.  Plan: - triamcinolone to treat possible arthropod bit or contact dermatitis - has f/u this Wednesday - return if worsening or failure to respond.  Patsy Lager Majed Pellegrin 8/29/201610:13 AM

## 2015-05-03 NOTE — MAU Note (Addendum)
First noted on Sat.  Arms, back, chest and abd..spots are red and raised, some itching.  Denies new meds. Possible new soap.

## 2015-05-05 ENCOUNTER — Encounter: Payer: Self-pay | Admitting: Advanced Practice Midwife

## 2015-05-08 ENCOUNTER — Inpatient Hospital Stay (HOSPITAL_COMMUNITY)
Admission: AD | Admit: 2015-05-08 | Discharge: 2015-05-08 | Disposition: A | Discharge status: Home / Self Care | Payer: Medicaid Other | Source: Ambulatory Visit | Attending: Obstetrics & Gynecology | Admitting: Obstetrics & Gynecology

## 2015-05-08 ENCOUNTER — Encounter (HOSPITAL_COMMUNITY): Payer: Self-pay | Admitting: *Deleted

## 2015-05-08 DIAGNOSIS — Z3A25 25 weeks gestation of pregnancy: Secondary | ICD-10-CM | POA: Insufficient documentation

## 2015-05-08 DIAGNOSIS — O99712 Diseases of the skin and subcutaneous tissue complicating pregnancy, second trimester: Secondary | ICD-10-CM

## 2015-05-08 DIAGNOSIS — R21 Rash and other nonspecific skin eruption: Secondary | ICD-10-CM | POA: Diagnosis present

## 2015-05-08 DIAGNOSIS — O26892 Other specified pregnancy related conditions, second trimester: Secondary | ICD-10-CM | POA: Insufficient documentation

## 2015-05-08 DIAGNOSIS — L509 Urticaria, unspecified: Secondary | ICD-10-CM | POA: Insufficient documentation

## 2015-05-08 DIAGNOSIS — O368121 Decreased fetal movements, second trimester, fetus 1: Secondary | ICD-10-CM

## 2015-05-08 DIAGNOSIS — L508 Other urticaria: Secondary | ICD-10-CM

## 2015-05-08 DIAGNOSIS — M7918 Myalgia, other site: Secondary | ICD-10-CM

## 2015-05-08 DIAGNOSIS — O368120 Decreased fetal movements, second trimester, not applicable or unspecified: Secondary | ICD-10-CM | POA: Insufficient documentation

## 2015-05-08 MED ORDER — DIPHENHYDRAMINE HCL 25 MG PO CAPS: 25.0000 mg | ORAL_CAPSULE | Freq: Four times a day (QID) | ORAL | Status: DC | PRN

## 2015-05-08 MED ORDER — PREDNISONE 20 MG PO TABS
20.0000 mg | ORAL_TABLET | Freq: Once | ORAL | Status: AC
  Administered 2015-05-08: 20 mg via ORAL
  Filled 2015-05-08: qty 1

## 2015-05-08 MED ORDER — PREDNISONE 20 MG PO TABS: 20.0000 mg | ORAL_TABLET | Freq: Once | ORAL | Status: DC

## 2015-05-08 NOTE — MAU Note (Addendum)
C/o rash that developed last Sat; seen in MAU on Monday; rash has gotten worse over the week; c/o intermittent L hip pain; c/o decreased fetal movement for past 2 days;

## 2015-05-08 NOTE — MAU Provider Note (Signed)
Chief Complaint:  Rash   First Provider Initiated Contact with Patient 05/08/15 1529     HPI: Misty Gamble is a 39 y.o. [id number] at [redacted]w[redacted]d who presents to maternity admissions reporting worsening of pruritic rash that started 1 week ago, decreased fetal movement 2 days. No associated difficulty breathing or swelling of lips or tongue. Was seen in maternity admissions a 29 2016 for this rash. Was prescribed triamcinolone cream which she has been using as directed twice a day. No improvement since starting triamcinolone. Rash is actually spread further since then. Denies startingany new medications, soaps, detergents or foods. Has been taking prenatal vitamins, DHA and Zyrtec for seasonal allergies. Stop Zyrtec 2 days ago.  When RN inquired about pain, patient reported left hip pain that feels like a muscle cramp has been going on for "a very long time". She does not request evaluation for this pain. She rates it 10/10 on pain scale. Worse with movement.   contractions, leakage of fluid or vaginal bleeding. Good fetal movement.   Past Medical History: Past Medical History  Diagnosis Date  . Aortic heart murmur on examination 08/19/2012    No previous Echo   . Elevated liver enzymes 08/19/2012  . Gestational diabetes     Past obstetric history: OB History  Gravida Para Term Preterm AB SAB TAB Ectopic Multiple Living  9 6 5 1 2 2    5     # Outcome Date GA Lbr Len/2nd Weight Sex Delivery Anes PTL Lv  9 Current           8 Term 2008 [redacted]w[redacted]d    Vag-Spont        Comments: no complications, born [place]  7 Term 2006 [redacted]w[redacted]d   M Vag-Spont        Comments: born [facility] in [place], [place], GDM  6 Preterm 2004     Vag-Spont     5 SAB 2003             Comments: twins  4 Term 1999 [redacted]w[redacted]d   F Vag-Spont        Comments: born [facility], no complications  3 Term 2595 [redacted]w[redacted]d   M Vag-Spont        Comments: no complications, born [facility]  2 Term 40 [redacted]w[redacted]d    Vag-Spont        Comments: no  complications, born in [place]  1 SAB 1994 [redacted]w[redacted]d            Comments: twins, both died.       Past Surgical History: Past Surgical History  Procedure Laterality Date  . Cholecystectomy       Family History: History reviewed. No pertinent family history.  Social History: Social History  Substance Use Topics  . Smoking status: Never Smoker   . Smokeless tobacco: Never Used  . Alcohol Use: No    Allergies:  Allergies  Allergen Reactions  . Ciprofloxacin Rash    Meds:  Prescriptions prior to admission  Medication Sig Dispense Refill Last Dose  . cetirizine (ZYRTEC) 10 MG tablet Take 1 tablet (10 mg total) by mouth daily. 30 tablet 3 Past Week at Unknown time  . fluticasone (FLONASE ALLERGY RELIEF) 50 MCG/ACT nasal spray Place 2 sprays into both nostrils 2 (two) times daily. 16 g 2 Past Month at Unknown time  . Prenatal Vit-Fe Fumarate-FA (PRENATAL MULTIVITAMIN) TABS tablet Take 1 tablet by mouth daily at 12 noon.   05/08/2015 at Unknown time  .  triamcinolone (KENALOG) 0.025 % ointment Apply 1 application topically 2 (two) times daily. (Patient not taking: Reported on 05/08/2015) 30 g 0 Not Taking at Unknown time    I have reviewed patient's Past Medical Hx, Surgical Hx, Family Hx, Social Hx, medications and allergies.   ROS:  Review of Systems  Constitutional: Positive for chills. Negative for fever.  HENT: Positive for congestion (all throughout pregnancy). Negative for facial swelling, rhinorrhea and sneezing.        Negative for swelling of lips or tongue.  Eyes: Negative for itching.  Respiratory: Negative for shortness of breath and wheezing.   Gastrointestinal: Negative for abdominal pain.  Genitourinary: Negative for dysuria, urgency, frequency, hematuria and vaginal bleeding.  Musculoskeletal:       Positive for left hip pain  Skin: Positive for rash.  Allergic/Immunologic: Positive for environmental allergies (seasonal). Negative for food allergies.    Physical  Exam  Patient Vitals for the past 24 hrs:  BP Temp Temp src Pulse Resp  05/08/15 1454 102/70 mmHg 97.2 F (36.2 C) Oral 83 16   Constitutional: Well-developed, well-nourished female in no acute distress.  Cardiovascular: normal rate Respiratory: normal effort. Clear to auscultation bilaterally. Head: Positive for nasal congestion. Negative for rhinorrhea, or swelling of lips or tongue. Skin: Splotchy, irregular,red, raised, pruritic rash covering half of abdomen, primarily upper abdomen, small portions of lower inner arms, small portions of lower and upper back and small area on right anterior thigh. Slightly excoriated. No vesicular lesions. GI: Abd soft, non-tender, gravid appropriate for gestational age.  MS: Extremities nontender, no edema, normal ROM Neurologic: Alert and oriented x 4.  GU: deferred     FHT:  Baseline 150 , moderate variability, accelerations present, few mild variable decelerations, reassuring for gestational age. Contractions: None   Labs: No results found for this or any previous visit (from the past 24 hour(s)).  Imaging:  No results found.  MAU Course: Prednisone given.  MDM: -39 year old pregnant female presents with worsening hives 1 week that did not respond to triamcinolone. Unable to identify triggers, but no evidence of life-threatening allergic reaction. We'll start prednisone 20 mg by mouth daily 7 days. Patient instructed to go to ED immediately for any difficulty breathing or swelling of lips or tongue.  -Decreased fetal movement 2 days, but baby active in maternity admissions and NST reassuring for gestational age.  -Long-term muscular left hip pain. Low concern for it being related to preterm contractions. Patient declines further evaluation or cervical exam.  Assessment: 1. Skin problem during pregnancy in second trimester, antepartum   2. Acute urticaria   3. Decreased fetal movement, second trimester, fetus 1   4. Musculoskeletal  pain    Plan: Discharge home in stable condition.  Preterm labor precautions and fetal kick counts Follow-up Information    Follow up with [facility]. Call on 05/11/2015.   Specialty:  Obstetrics and Gynecology   Why:  As needed if No improvement in symptoms   Contact information:   [place] [phone]        Medication List    STOP taking these medications        fluticasone 50 MCG/ACT nasal spray  Commonly known as:  FLONASE ALLERGY RELIEF      TAKE these medications        cetirizine 10 MG tablet  Commonly known as:  ZYRTEC  Take 1 tablet (10 mg total) by mouth daily.  diphenhydrAMINE 25 mg capsule  Commonly known as:  BENADRYL  Take 1 capsule (25 mg total) by mouth every 6 (six) hours as needed for itching or allergies.     predniSONE 20 MG tablet  Commonly known as:  DELTASONE  Take 1 tablet (20 mg total) by mouth once.     prenatal multivitamin Tabs tablet  Take 1 tablet by mouth daily at 12 noon.     triamcinolone 0.025 % ointment  Commonly known as:  KENALOG  Apply 1 application topically 2 (two) times daily.        [place], CNM 05/08/2015 4:25 PM

## 2015-05-08 NOTE — Discharge Instructions (Signed)
Ronchas  (Hives)  Las ronchas son reas de la piel inflamadas (hinchadas) rojas y que pican. Pueden cambiar de tamao y de ubicacin en el cuerpo. Las Administrator, Civil Service y Armed forces operational officer durante algunas horas o das (ronchas agudas) o durante algunas semanas (ronchas crnicas). No pueden transmitirse de [place] persona a Alcus Dad (no son contagiosas). Pueden empeorar al rascarse, hacer ejercicios y por estrs emocional.  CAUSAS   Reaccin alrgica a alimentos, aditivos o frmacos.  Infecciones, incluso el resfro comn.  Enfermedades, como la vasculitis, el lupus o la enfermedad tiroidea.  Exposicin al sol, al calor o al fro.  La prctica de ejercicios.  El estrs.  El contacto con algunas sustancias qumicas. SNTOMAS   Zonas hinchadas, rojas o blancas, sobre la piel. Las ronchas pueden cambiar de [place], forma, [place] y Chief Executive Officer.  Picazn.  Hinchazn de las [employer] y [place]. Esto puede ocurrir si las ronchas se desarrollan en capas profundas de la piel. DIAGNSTICO  El mdico puede diagnosticar el problema haciendo un examen fsico. [place] Comment indicar anlisis de sangre o un estudio de la piel para Office manager causa. En algunos casos, no puede determinarse la causa.  TRATAMIENTO  Los casos leves generalmente mejoran con medicamentos como los antihistamnicos. Los casos ms graves pueden requerir una inyeccin de epinefrina de Freight forwarder. Si se conoce la causa de la urticaria, el tratamiento incluye evitar el factor desencadenante.  INSTRUCCIONES PARA EL CUIDADO EN EL HOGAR   Evite las causas que han desencadenado las ronchas.  Tome los antihistamnicos segn las indicaciones del mdico para reducir la gravedad de las ronchas. Generalmente se recomiendan los [employer] no son sedantes o con bajo efecto sedante. No conduzca vehculos mientras toma antihistamnicos.  Tome los medicamentos para la picazn exactamente como le indic el  mdico.  Use ropas sueltas.  Cumpla con todas las visitas de control, segn le indique su mdico. SOLICITE ATENCIN MDICA SI:   Siente una picazn intensa o persistente que no se calma con los medicamentos.  [place] articulaciones o estn inflamadas. SOLICITE ATENCIN MDICA DE INMEDIATO SI:   Tiene fiebre.  Tiene la boca o los labios hinchados.  Tiene problemas para respirar o tragar.  Siente una opresin en la garganta o en el pecho.  Siente dolor abdominal. Estos problemas pueden ser los primeros signos de una reaccin alrgica que ponga en peligro la vida. Llame a los servicios de emergencia locales (911 en [place]). ASEGRESE DE QUE:   Comprende estas instrucciones.  Controlar su enfermedad.  Solicitar ayuda de inmediato si no mejora o si empeora. Document Released: 08/21/2005 Document Revised: 08/26/2013 [facility] Patient Information 2015 [place]. This information is not intended to replace advice given to you by your health care provider. Make sure you discuss any questions you have with your health care provider.

## 2015-05-21 ENCOUNTER — Encounter (HOSPITAL_COMMUNITY): Payer: Self-pay

## 2015-05-21 ENCOUNTER — Ambulatory Visit (HOSPITAL_COMMUNITY)
Admission: RE | Admit: 2015-05-21 | Discharge: 2015-05-21 | Disposition: A | Discharge status: Home / Self Care | Payer: Medicaid Other | Source: Ambulatory Visit | Attending: Nurse Practitioner | Admitting: Nurse Practitioner

## 2015-05-21 DIAGNOSIS — O09292 Supervision of pregnancy with other poor reproductive or obstetric history, second trimester: Secondary | ICD-10-CM | POA: Insufficient documentation

## 2015-05-21 DIAGNOSIS — Z3A27 27 weeks gestation of pregnancy: Secondary | ICD-10-CM | POA: Diagnosis not present

## 2015-05-21 DIAGNOSIS — O09522 Supervision of elderly multigravida, second trimester: Secondary | ICD-10-CM | POA: Diagnosis present

## 2015-05-21 DIAGNOSIS — Z8632 Personal history of gestational diabetes: Secondary | ICD-10-CM

## 2015-05-21 DIAGNOSIS — O352XX0 Maternal care for (suspected) hereditary disease in fetus, not applicable or unspecified: Secondary | ICD-10-CM | POA: Diagnosis not present

## 2015-05-21 DIAGNOSIS — O09299 Supervision of pregnancy with other poor reproductive or obstetric history, unspecified trimester: Secondary | ICD-10-CM

## 2015-05-26 ENCOUNTER — Encounter: Payer: Self-pay | Admitting: Advanced Practice Midwife

## 2015-06-02 ENCOUNTER — Ambulatory Visit (INDEPENDENT_AMBULATORY_CARE_PROVIDER_SITE_OTHER): Payer: Medicaid Other | Admitting: Obstetrics and Gynecology

## 2015-06-02 VITALS — BP 111/72 | HR 71 | Temp 97.9°F | Wt 152.8 lb

## 2015-06-02 DIAGNOSIS — O0930 Supervision of pregnancy with insufficient antenatal care, unspecified trimester: Secondary | ICD-10-CM | POA: Insufficient documentation

## 2015-06-02 DIAGNOSIS — O24419 Gestational diabetes mellitus in pregnancy, unspecified control: Secondary | ICD-10-CM

## 2015-06-02 DIAGNOSIS — O0933 Supervision of pregnancy with insufficient antenatal care, third trimester: Secondary | ICD-10-CM | POA: Diagnosis not present

## 2015-06-02 DIAGNOSIS — Z8759 Personal history of other complications of pregnancy, childbirth and the puerperium: Secondary | ICD-10-CM

## 2015-06-02 DIAGNOSIS — Z23 Encounter for immunization: Secondary | ICD-10-CM

## 2015-06-02 DIAGNOSIS — O0993 Supervision of high risk pregnancy, unspecified, third trimester: Secondary | ICD-10-CM | POA: Diagnosis not present

## 2015-06-02 DIAGNOSIS — O0992 Supervision of high risk pregnancy, unspecified, second trimester: Secondary | ICD-10-CM | POA: Diagnosis present

## 2015-06-02 DIAGNOSIS — O0990 Supervision of high risk pregnancy, unspecified, unspecified trimester: Secondary | ICD-10-CM

## 2015-06-02 HISTORY — DX: Gestational diabetes mellitus in pregnancy, unspecified control: O24.419

## 2015-06-02 LAB — POCT URINALYSIS DIP (DEVICE)
GLUCOSE, UA: 100 mg/dL — AB
Hgb urine dipstick: NEGATIVE
KETONES UR: 15 mg/dL — AB
Leukocytes, UA: NEGATIVE
Nitrite: NEGATIVE
PH: 5.5 (ref 5.0–8.0)
PROTEIN: NEGATIVE mg/dL
Specific Gravity, Urine: >=1.03 (ref 1.005–1.030)
Urobilinogen, UA: 0.2 mg/dL (ref 0.0–1.0)

## 2015-06-02 LAB — CBC
HEMATOCRIT: 35.3 % — AB (ref 36.0–46.0)
Hemoglobin: 12.1 g/dL (ref 12.0–15.0)
MCH: 31.4 pg (ref 26.0–34.0)
MCHC: 34.3 g/dL (ref 30.0–36.0)
MCV: 91.7 fL (ref 78.0–100.0)
MPV: 11.4 fL (ref 8.6–12.4)
Platelets: 186 10*3/uL (ref 150–400)
RBC: 3.85 MIL/uL — ABNORMAL LOW (ref 3.87–5.11)
RDW: 13.6 % (ref 11.5–15.5)
WBC: 6.3 10*3/uL (ref 4.0–10.5)

## 2015-06-02 MED ORDER — TETANUS-DIPHTH-ACELL PERTUSSIS 5-2.5-18.5 LF-MCG/0.5 IM SUSP
0.5000 mL | Freq: Once | INTRAMUSCULAR | Status: AC
Start: 2015-06-02 — End: 2015-06-02
  Administered 2015-06-02: 0.5 mL via INTRAMUSCULAR

## 2015-06-02 NOTE — Progress Notes (Signed)
Breastfeeding tip of the week reviewed Flu and tdap today

## 2015-06-02 NOTE — Progress Notes (Signed)
Subjective:  Misty Gamble is a 39 y.o. [id number] at [redacted]w[redacted]d being seen today for ongoing prenatal care.  Patient reports few contractions yesterday.   Contractions: Irregular.  Vag. Bleeding: None. Movement: Present. Denies leaking of fluid.   The following portions of the patient's history were reviewed and updated as appropriate: allergies, current medications, past family history, past medical history, past social history, past surgical history and problem list.   Objective:   Filed Vitals:   06/02/15 1014  BP: 111/72  Pulse: 71  Temp: 97.9 F (36.6 C)  Weight: 152 lb 12.8 oz (69.31 kg)    Fetal Status: Fetal Heart Rate (bpm): 142 Fundal Height: 29 cm Movement: Present     General:  Alert, oriented and cooperative. Patient is in no acute distress.  Skin: Skin is warm and dry. No rash noted.   Cardiovascular: Normal heart rate noted  Respiratory: Normal respiratory effort, no problems with respiration noted  Abdomen: Soft, gravid, appropriate for gestational age. Pain/Pressure: Present     Pelvic: Vag. Bleeding: None     Cervical exam deferred        Extremities: Normal range of motion.  Edema: None  Mental Status: Normal mood and affect. Normal behavior. Normal judgment and thought content.   Urinalysis: Urine Protein: Negative Urine Glucose: 1+  Assessment and Plan:  Pregnancy: [id number] at [redacted]w[redacted]d  1. Supervision of high risk pregnancy, antepartum, unspecified trimester - flu and tdap today - cbc, hiv, and rpr today - likely braxton hicks contractions, none present today  # Gestational diabetes mellitus - 1-hour at HD 150, called over and received results of 3-hour, which failed (97/109/162/130) - scheduling to see our diabetic nurse educator tomorrow or next Monday (first available) - f/u here the week after that - transfer to high-risk clinic, f/u 2 weeks  2. History of fetal loss - serial growth scans, scheduling next today - start antenatal testing at 28  wks  3. Limited prenatal care, unspecified trimester - reinforced importance of prenatal visits!  Preterm labor symptoms and general obstetric precautions including but not limited to vaginal bleeding, contractions, leaking of fluid and fetal movement were reviewed in detail with the patient. Please refer to After Visit Summary for other counseling recommendations.  Return in about 4 weeks (around 06/30/2015).   Gwynne Edinger, MD

## 2015-06-03 ENCOUNTER — Ambulatory Visit (HOSPITAL_COMMUNITY): Payer: Medicaid Other | Attending: Obstetrics and Gynecology

## 2015-06-03 LAB — HIV ANTIBODY (ROUTINE TESTING W REFLEX): HIV: NONREACTIVE

## 2015-06-03 LAB — RPR

## 2015-06-04 ENCOUNTER — Encounter: Payer: Self-pay | Admitting: *Deleted

## 2015-06-14 ENCOUNTER — Encounter: Payer: Medicaid Other | Admitting: Obstetrics & Gynecology

## 2015-06-16 ENCOUNTER — Encounter: Payer: Medicaid Other | Admitting: Certified Nurse Midwife

## 2015-06-28 ENCOUNTER — Encounter: Payer: Self-pay | Admitting: *Deleted

## 2015-07-05 ENCOUNTER — Encounter: Payer: Medicaid Other | Admitting: Obstetrics and Gynecology

## 2015-07-08 ENCOUNTER — Other Ambulatory Visit: Payer: Self-pay | Admitting: Obstetrics and Gynecology

## 2015-07-08 ENCOUNTER — Encounter (HOSPITAL_COMMUNITY): Payer: Self-pay

## 2015-07-08 ENCOUNTER — Ambulatory Visit (HOSPITAL_COMMUNITY)
Admission: RE | Admit: 2015-07-08 | Discharge: 2015-07-08 | Disposition: A | Discharge status: Home / Self Care | Payer: Medicaid Other | Source: Ambulatory Visit | Attending: Obstetrics and Gynecology | Admitting: Obstetrics and Gynecology

## 2015-07-08 DIAGNOSIS — Z3A34 34 weeks gestation of pregnancy: Secondary | ICD-10-CM | POA: Insufficient documentation

## 2015-07-08 DIAGNOSIS — Z8759 Personal history of other complications of pregnancy, childbirth and the puerperium: Secondary | ICD-10-CM

## 2015-07-08 DIAGNOSIS — O0990 Supervision of high risk pregnancy, unspecified, unspecified trimester: Secondary | ICD-10-CM

## 2015-07-08 DIAGNOSIS — O09523 Supervision of elderly multigravida, third trimester: Secondary | ICD-10-CM | POA: Diagnosis present

## 2015-07-08 DIAGNOSIS — O09293 Supervision of pregnancy with other poor reproductive or obstetric history, third trimester: Secondary | ICD-10-CM | POA: Diagnosis not present

## 2015-07-08 DIAGNOSIS — O352XX0 Maternal care for (suspected) hereditary disease in fetus, not applicable or unspecified: Secondary | ICD-10-CM | POA: Diagnosis not present

## 2015-07-12 ENCOUNTER — Encounter: Payer: Medicaid Other | Admitting: Family Medicine

## 2015-07-12 ENCOUNTER — Ambulatory Visit (INDEPENDENT_AMBULATORY_CARE_PROVIDER_SITE_OTHER): Payer: Medicaid Other | Admitting: Obstetrics & Gynecology

## 2015-07-12 VITALS — BP 108/72 | HR 82 | Wt 155.6 lb

## 2015-07-12 DIAGNOSIS — O24419 Gestational diabetes mellitus in pregnancy, unspecified control: Secondary | ICD-10-CM | POA: Diagnosis present

## 2015-07-12 DIAGNOSIS — O09293 Supervision of pregnancy with other poor reproductive or obstetric history, third trimester: Secondary | ICD-10-CM | POA: Diagnosis not present

## 2015-07-12 DIAGNOSIS — O0993 Supervision of high risk pregnancy, unspecified, third trimester: Secondary | ICD-10-CM

## 2015-07-12 LAB — POCT URINALYSIS DIP (DEVICE)
Bilirubin Urine: NEGATIVE
Glucose, UA: NEGATIVE mg/dL
Hgb urine dipstick: NEGATIVE
Ketones, ur: NEGATIVE mg/dL
Leukocytes, UA: NEGATIVE
Nitrite: NEGATIVE
Protein, ur: NEGATIVE mg/dL
Specific Gravity, Urine: 1.025 (ref 1.005–1.030)
Urobilinogen, UA: 0.2 mg/dL (ref 0.0–1.0)
pH: 6 (ref 5.0–8.0)

## 2015-07-12 NOTE — Progress Notes (Signed)
Pt has missed several appointments, including Diabetes Education. Pt was last seen on 06/02/15.  She does not think that she has gestational diabetes.

## 2015-07-12 NOTE — Patient Instructions (Signed)
Diabetes mellitus gestacional (Gestational Diabetes Mellitus) La diabetes mellitus gestacional, ms comnmente conocida como diabetes gestacional es un tipo de diabetes que desarrollan algunas mujeres durante el [employer]. En la diabetes gestacional, el pncreas no produce suficiente insulina (una hormona) o las clulas son menos sensibles a la insulina producida (resistencia a la insulina), o ambas cosas. Normalmente, la [employer] azcares de los alimentos a las clulas de los tejidos. Las clulas de los tejidos [employer] azcares para Dealer. La falta de insulina o la falta de una respuesta normal a la insulina hace que el exceso de azcar se acumule en la sangre en lugar de Location manager en las clulas de los tejidos. Como resultado, se producen niveles altos de Dispensing optician (hiperglucemia). El efecto de los niveles altos de Location manager (glucosa) puede causar muchos problemas.  FACTORES DE RIESGO Usted tiene mayor probabilidad de desarrollar diabetes gestacional si tiene antecedentes familiares de diabetes y tambin si tiene uno o ms de los siguientes factores de riesgo:  ndice de masa corporal superior a 30 (obesidad).  Embarazo previo con diabetes gestacional.  La edad avanzada en el momento del embarazo. Si se mantienen los niveles de glucosa en la sangre en un rango normal durante el [place], las mujeres pueden tener un embarazo saludable. Si los niveles de glucosa en la sangre no estn bien controlados, puede haber riesgos para usted, el feto o el recin nacido, o durante el trabajo de parto y [place].  SNTOMAS  Si se presentan sntomas, stos son similares a los sntomas que normalmente experimentar durante el embarazo. Los sntomas de la diabetes gestacional son:   Lovey Newcomer de la sed (polidipsia).  Aumento de la miccin (poliuria).  Orina con ms frecuencia durante la noche (nocturia).  Prdida de peso. La prdida de peso puede ser muy rpida.  Infecciones  frecuentes y recurrentes.  Cansancio (fatiga).  Debilidad.  Cambios en la visin, como visin borrosa.  Olor a Medical illustrator.  Dolor abdominal. DIAGNSTICO La diabetes se diagnostica cuando hay aumento de los niveles de glucosa en la [place]. El nivel de glucosa en la sangre puede controlarse en uno o ms de los siguientes anlisis de sangre:  Medicin de glucosa en la sangre en [place]. No se le permitir comer durante al menos 8 horas antes de que se tome [place] de [place].  Pruebas al azar de glucosa en la sangre. El nivel de glucosa en la sangre se controla en cualquier momento del da sin importar el momento en que haya comido.  Prueba de tolerancia a la glucosa oral (PTGO). La glucosa en la sangre se mide despus de no haber comido (ayunas) durante una a tres horas y despus de beber una bebida que contenga glucosa. Dado que las hormonas que causan la resistencia a la insulina son ms altas alrededor [employer] International Corporation 24 a 28 de Media planner, generalmente se realiza una PTGO durante ese tiempo. Si tiene factores de riesgo, en la primera visita prenatal pueden hacerle pruebas de deteccin de diabetes tipo 2 no diagnosticada. TRATAMIENTO  La diabetes gestacional debe controlarse en primer lugar con dieta y ejercicios. Pueden agregarse medicamentos, pero solo si son necesarios.  Usted tendr que tomar medicamentos para la diabetes o insulina diariamente para Theatre manager los niveles de glucosa en la sangre en el rango deseado.  Usted tendr [employer]aya dosis de insulina con la actividad fsica y la eleccin de alimentos saludables. Si tiene diabetes gestacional, el objetivo del tratamiento ser  mantener los siguientes niveles sanguneos de glucosa:  Antes de las comidas (preprandial): valor de 95 mg/dl o inferior.  Despus de las comidas (posprandial):  Una hora despus de la comida: valor de 140 mg/dl o inferior.  Dos horas despus de la comida: valor de 120 mg/dl o  inferior. Si tiene diabetes tipo 1 o tipo 2 preexistente, el objetivo del tratamiento ser [employer] siguientes niveles sanguneos de glucosa:  Antes de las comidas, a la hora de acostarse y durante la noche: de 60 a 99 mg/dl.  Despus de las comidas: valor mximo de 100 a 129 mg/dl. INSTRUCCIONES PARA EL CUIDADO EN EL HOGAR   Controle su nivel de hemoglobina A1c dos veces al ao.  Contrlese a diario el nivel de glucosa en la sangre segn las indicaciones de su mdico. Es comn Optometrist controles frecuentes de la glucosa en la [place].  Supervise las cetonas en la orina cuando est enferma y segn las indicaciones de su [place] medicamento para la diabetes y adminstrese insulina segn las indicaciones de su mdico para Contractor nivel de glucosa en la sangre en el rango deseado.  Nunca se quede sin medicamento para la diabetes o sin insulina. Es necesario que la reciba [employer].  Ajuste la insulina segn la ingesta de hidratos de carbono. Los hidratos de carbono pueden aumentar los niveles de glucosa en la sangre, pero deben incluirse en su dieta. Los hidratos de carbono aportan vitaminas, minerales y [place] que son Ardelia Mems parte esencial de una dieta saludable. Los hidratos de carbono se encuentran en frutas, verduras, cereales integrales, productos lcteos, legumbres y alimentos que contienen azcares aadidos.  Consuma alimentos saludables. Alterne 3 comidas con 3 colaciones.  Aumente de peso saludablemente. El aumento del peso total vara de acuerdo con el ndice de masa corporal que tena antes del embarazo [employer]).  Lleve una tarjeta de alerta mdica o use una pulsera o medalla de alerta mdica.  Lleve con usted una colacin de 15gramos de hidratos de carbono en todo momento para controlar los niveles bajos de glucosa en la sangre (hipoglucemia). Algunos ejemplos de colaciones de 15gramos de hidratos de carbono son los siguientes:  Tabletas de glucosa, 3 o 4.  Gel  de glucosa, tubo de 15 gramos.  Pasas de uva, 2 cucharadas (24 g).  Caramelos de goma, 6.  Galletas de [place], 8.  Jugo de fruta, gaseosa comn, o [place], 4 onzas (120 ml).  Pastillas de goma, 9.  Reconocer la hipoglucemia. Durante el embarazo la hipoglucemia se produce cuando hay niveles de glucosa en la sangre de 60 mg/dl o menos. El riesgo de hipoglucemia aumenta durante el ayuno o cuando se saltea las comidas, durante o despus de Optometrist ejercicio intenso y [place] duerme. Los sntomas de hipoglucemia son:  Temblores o sacudidas.  Disminucin de la capacidad de concentracin.  Sudoracin.  Aumento de la frecuencia cardaca.  Dolor de [place].  Sequedad en la boca.  Hambre.  Irritabilidad.  Ansiedad.  Sueo agitado.  Alteracin del habla o de la coordinacin.  Confusin.  Tratar la hipoglucemia rpidamente. Si usted est alerta y puede tragar con seguridad, siga la regla de 15/15 que consiste en:  [employer]ck & [employer] 15 y 20gramos de glucosa de accin rpida o carbohidratos. Las opciones de accin rpida son un gel de glucosa, tabletas de glucosa, o 4 onzas (120 ml) de jugo de frutas, gaseosa comn, o leche baja en grasa.  Compruebe su nivel de glucosa en la [employer] Corporation  15 minutos despus de tomar la glucosa.  Tome entre 15 y 79 gramos ms de glucosa si el nivel de glucosa en la sangre todava es de 70mg /dl o inferior.  Ingiera una comida o una colacin en el lapso de 1 hora una vez que los niveles de glucosa en la sangre vuelven a la normalidad.  Est atento a la poliuria (miccin excesiva) y la polidipsia (sensacin de mucha sed), que son los primeros signos de la hiperglucemia. El reconocimiento temprano de la hiperglucemia permite un tratamiento oportuno. Trate la hiperglucemia segn le indic su mdico.  Haga actividad fsica por lo menos 45minutos al da o como lo indique su mdico. Se recomienda que 30 minutos despus de cada comida, realice diez minutos  de actividad fsica para controlar los niveles de glucosa postprandial en la [place].  Ajuste su dosis de insulina y la ingesta de alimentos, segn sea necesario, si inicia un nuevo ejercicio o deporte.  Siga su plan para los das de enfermedad cuando no pueda comer o beber como de [place].  Evite el tabaco y el alcohol.  Concurra a todas las visitas de control como se lo haya indicado el mdico.  Siga el consejo del mdico respecto a los controles prenatales y posteriores al parto (postparto), las visitas, la planificacin de las comidas, el ejercicio, los medicamentos, las vitaminas, los anlisis de [place], otras pruebas mdicas y [place] fsicas.  Realice diariamente el cuidado de la piel y de los pies. Examine su piel y los pies diariamente para ver si tiene cortes, moretones, enrojecimiento, problemas en las uas, sangrado, ampollas o Pension scheme manager.  Cepllese los dientes y encas por lo menos dos veces al da y use hilo dental al menos una vez por da. Concurra regularmente a las visitas de control con el dentista.  Programe un examen de vista durante el primer trimestre de su embarazo o como lo indique su mdico.  Comparta su plan de control de diabetes en el trabajo o en la escuela.  [place].  Aprenda a Engineer, maintenance (IT).  Obtenga la mayor cantidad posible de informacin sobre la diabetes y solicite ayuda siempre que sea necesario.  Obtenga informacin sobre el amamantamiento y analice esta posibilidad.  Debe controlar el nivel de azcar en la sangre de 6a 12semanas despus del parto. Esto se hace con una prueba de tolerancia a la glucosa oral (PTGO). SOLICITE ATENCIN MDICA SI:   No puede comer alimentos o beber por ms de 6 horas.  Tuvo nuseas o ha vomitado durante ms de 6 horas.  Tiene un nivel de glucosa en la sangre de 200 mg/dl y cetonas en la orina.  Presenta algn cambio en el estado mental.  Desarrolla problemas de visin.  Sufre  un dolor persistente de Pensions consultant.  Siente dolor o molestias en la parte superior del abdomen.  Desarrolla una enfermedad grave adicional.  Tuvo diarrea durante ms de 6 horas.  Ha estado enfermo o ha tenido fiebre durante un par de das y no mejora. SOLICITE ATENCIN MDICA DE INMEDIATO SI:   Tiene dificultad para respirar.  Ya no siente los movimientos del beb.  Est sangrando o tiene flujo vaginal.  Comienza a tener contracciones o trabajo de [employer] prematuro. ASEGRESE DE QUE:  Comprende estas instrucciones.  Controlar su afeccin.  Recibir ayuda de inmediato si no mejora o si empeora.   Esta informacin no tiene Marine scientist el consejo del mdico. Asegrese de hacerle al mdico cualquier pregunta que tenga.  Document Released: 05/31/2005 Document Revised: 09/11/2014 Elsevier Interactive Patient Education [employer].

## 2015-07-12 NOTE — Progress Notes (Signed)
NST reactive  Subjective:  Misty Gamble is a 39 y.o. [id number] at [redacted]w[redacted]d being seen today for ongoing prenatal care.  Patient reports no complaints.  Contractions: Irregular.  Vag. Bleeding: None. Movement: Present. Denies leaking of fluid.   The following portions of the patient's history were reviewed and updated as appropriate: allergies, current medications, past family history, past medical history, past social history, past surgical history and problem list. Problem list updated.  Objective:   Filed Vitals:   07/12/15 1319  BP: 108/72  Pulse: 82  Weight: 155 lb 9.6 oz (70.58 kg)    Fetal Status: Fetal Heart Rate (bpm): NST   Movement: Present     General:  Alert, oriented and cooperative. Patient is in no acute distress.  Skin: Skin is warm and dry. No rash noted.   Cardiovascular: Normal heart rate noted  Respiratory: Normal respiratory effort, no problems with respiration noted  Abdomen: Soft, gravid, appropriate for gestational age. Pain/Pressure: Present     Pelvic: Vag. Bleeding: None     Cervical exam deferred        Extremities: Normal range of motion.  Edema: None  Mental Status: Normal mood and affect. Normal behavior. Normal judgment and thought content.   Urinalysis:      Assessment and Plan:  Pregnancy: [id number] at [redacted]w[redacted]d  1. Gestational diabetes mellitus in third trimester, unspecified diabetic control NST reactive - Fetal nonstress test Needs to start testing BG 2. Hx of intrauterine fetal death, currently pregnant, third trimester Fetal testing, delivery by 39 weeks  3. Supervision of high risk pregnancy, antepartum, third trimester   Preterm labor symptoms and general obstetric precautions including but not limited to vaginal bleeding, contractions, leaking of fluid and fetal movement were reviewed in detail with the patient. Please refer to After Visit Summary for other counseling recommendations.  Return in about 7 days (around 07/19/2015) for Ob  fu and diabetes ed. .  2/week testing NST. [place] result reviewed   Woodroe Mode, MD

## 2015-07-15 ENCOUNTER — Ambulatory Visit (INDEPENDENT_AMBULATORY_CARE_PROVIDER_SITE_OTHER): Payer: Medicaid Other | Admitting: *Deleted

## 2015-07-15 VITALS — BP 110/76 | HR 73

## 2015-07-15 DIAGNOSIS — O09523 Supervision of elderly multigravida, third trimester: Secondary | ICD-10-CM | POA: Diagnosis not present

## 2015-07-15 DIAGNOSIS — O09293 Supervision of pregnancy with other poor reproductive or obstetric history, third trimester: Secondary | ICD-10-CM

## 2015-07-15 NOTE — Progress Notes (Signed)
NST reviewed and reactive.  

## 2015-07-19 ENCOUNTER — Encounter: Payer: Self-pay | Admitting: Obstetrics and Gynecology

## 2015-07-19 ENCOUNTER — Ambulatory Visit (INDEPENDENT_AMBULATORY_CARE_PROVIDER_SITE_OTHER): Payer: Medicaid Other | Admitting: Obstetrics and Gynecology

## 2015-07-19 ENCOUNTER — Other Ambulatory Visit (HOSPITAL_COMMUNITY)
Admission: RE | Admit: 2015-07-19 | Discharge: 2015-07-19 | Disposition: A | Discharge status: Home / Self Care | Payer: Medicaid Other | Source: Ambulatory Visit | Attending: Obstetrics and Gynecology | Admitting: Obstetrics and Gynecology

## 2015-07-19 ENCOUNTER — Encounter: Payer: Medicaid Other | Attending: Family Medicine | Admitting: *Deleted

## 2015-07-19 VITALS — BP 110/71 | HR 63 | Ht 61.0 in | Wt 158.7 lb

## 2015-07-19 DIAGNOSIS — O24419 Gestational diabetes mellitus in pregnancy, unspecified control: Secondary | ICD-10-CM

## 2015-07-19 DIAGNOSIS — O09523 Supervision of elderly multigravida, third trimester: Secondary | ICD-10-CM

## 2015-07-19 DIAGNOSIS — O09293 Supervision of pregnancy with other poor reproductive or obstetric history, third trimester: Secondary | ICD-10-CM

## 2015-07-19 DIAGNOSIS — Z113 Encounter for screening for infections with a predominantly sexual mode of transmission: Secondary | ICD-10-CM | POA: Diagnosis not present

## 2015-07-19 DIAGNOSIS — O0993 Supervision of high risk pregnancy, unspecified, third trimester: Secondary | ICD-10-CM

## 2015-07-19 LAB — POCT URINALYSIS DIP (DEVICE)
Bilirubin Urine: NEGATIVE
GLUCOSE, UA: NEGATIVE mg/dL
Hgb urine dipstick: NEGATIVE
KETONES UR: NEGATIVE mg/dL
Leukocytes, UA: NEGATIVE
Nitrite: NEGATIVE
PH: 6 (ref 5.0–8.0)
PROTEIN: NEGATIVE mg/dL
Specific Gravity, Urine: 1.025 (ref 1.005–1.030)
Urobilinogen, UA: 0.2 mg/dL (ref 0.0–1.0)

## 2015-07-19 MED ORDER — GLUCOSE BLOOD VI STRP: ORAL_STRIP | Status: DC

## 2015-07-19 MED ORDER — ACCU-CHEK NANO SMARTVIEW W/DEVICE KIT: 1.0000 | PACK | Freq: Four times a day (QID) | Status: DC

## 2015-07-19 MED ORDER — ACCU-CHEK FASTCLIX LANCETS MISC: 1.0000 [IU] | Freq: Four times a day (QID) | Status: DC

## 2015-07-19 NOTE — Addendum Note (Signed)
Addended by: Bernie Covey on: 07/19/2015 12:22 PM   Modules accepted: Orders, Medications

## 2015-07-19 NOTE — Progress Notes (Signed)
Pt to see Nutritionist for GDM diet instruction and Diabetes Educator for CBG instruction.

## 2015-07-19 NOTE — Progress Notes (Signed)
Subjective:  Misty Gamble is a 39 y.o. [id number] at [redacted]w[redacted]d being seen today for ongoing prenatal care.  Patient reports no complaints.  Contractions: Irregular.  Vag. Bleeding: None. Movement: Present. Denies leaking of fluid.   The following portions of the patient's history were reviewed and updated as appropriate: allergies, current medications, past family history, past medical history, past social history, past surgical history and problem list. Problem list updated.  Objective:   Filed Vitals:   07/19/15 0939 07/19/15 1048  BP: 110/71   Pulse: 63   Height:  5\' 1"  (1.549 m)  Weight: 158 lb 11.2 oz (71.986 kg)     Fetal Status: Fetal Heart Rate (bpm): NST   Movement: Present     General:  Alert, oriented and cooperative. Patient is in no acute distress.  Skin: Skin is warm and dry. No rash noted.   Cardiovascular: Normal heart rate noted  Respiratory: Normal respiratory effort, no problems with respiration noted  Abdomen: Soft, gravid, appropriate for gestational age. Pain/Pressure: Present     Pelvic: Vag. Bleeding: None     Cervical exam deferred        Extremities: Normal range of motion.  Edema: None  Mental Status: Normal mood and affect. Normal behavior. Normal judgment and thought content.   Urinalysis: Urine Protein: Negative Urine Glucose: Negative  Assessment and Plan:  Pregnancy: [id number] at [redacted]w[redacted]d  1. Advanced maternal age in multigravida, third trimester   2. Hx of intrauterine fetal death, currently pregnant, third trimester NST reviewed and reactive  3. Gestational diabetes mellitus in third trimester, unspecified diabetic control Patient has not been checking CBGs She was aware of her diagnosis of GDM but reports having faith in God and knows everything will be okay. She has been resistant to the diagnosis of GDM. She has missed several appointment as well Patient to meet with diabetic educator today and start testing CBGs  4. Supervision of high risk  pregnancy, antepartum, third trimester Patient left before cultures could be performed. Cultures to be collected at her next visit - Culture, beta strep (group b only) - GC/Chlamydia Probe Amp  Preterm labor symptoms and general obstetric precautions including but not limited to vaginal bleeding, contractions, leaking of fluid and fetal movement were reviewed in detail with the patient. Please refer to After Visit Summary for other counseling recommendations.  Return in about 1 week (around 07/26/2015).   Mora Bellman, MD

## 2015-07-19 NOTE — Addendum Note (Signed)
Addended by: Christiana Pellant A on: 07/19/2015 11:40 AM   Modules accepted: Orders

## 2015-07-19 NOTE — Progress Notes (Signed)
  Patient was seen on 07/19/15 for Gestational Diabetes self-management . The following learning objectives were met by the patient :   States the definition of Gestational Diabetes  States why dietary management is important in controlling blood glucose  States when to check blood glucose levels  Demonstrates proper blood glucose monitoring techniques  States the effect of stress and exercise on blood glucose levels  States the importance of limiting caffeine and abstaining from alcohol and smoking  Plan:  Consider  increasing your activity level by walking daily as tolerated Begin checking BG before breakfast and 2 hours after first bit of breakfast, lunch and dinner after  as directed by MD  Take medication  as directed by MD  Patient instructed to monitor glucose levels: FBS: 60 - <90 2 hour: <120  Patient received the following handouts:  Nutrition Diabetes and Pregnancy  Carbohydrate Counting List  Meal Planning worksheet  Patient will be seen for follow-up as needed.

## 2015-07-19 NOTE — Addendum Note (Signed)
Addended by: Novella Olive on: 07/19/2015 03:13 PM   Modules accepted: Orders

## 2015-07-19 NOTE — Progress Notes (Signed)
Nutrition note: GDM diet education Pt has gained 10.7# @ [redacted]w[redacted]d, which is < expected but pt lost wt initially so rate of gain lately has been wnl. Pt reports eating 3 meals & 2 snacks/d. Pt is taking a PNV. Pt reports no N&V but has heartburn occ. Pt reports doing zumba 3-4x/wk. Pt received verbal & written education in Spanish about GDM diet (pt speaks [place] but preferred [place] handouts). Discussed wt gain goals of 15-25# or 0.6#/wk. Pt agrees to follow GDM diet with 3 meals & 1-3 snacks/d. Pt has [place] & plans to BF. F/u in 1-2 wks Vladimir Faster, MS, RD, LDN, [facility]

## 2015-07-20 LAB — GC/CHLAMYDIA PROBE AMP (~~LOC~~) NOT AT ARMC
CHLAMYDIA, DNA PROBE: NEGATIVE
NEISSERIA GONORRHEA: NEGATIVE

## 2015-07-26 ENCOUNTER — Encounter: Payer: Medicaid Other | Admitting: Family Medicine

## 2015-07-27 ENCOUNTER — Ambulatory Visit (INDEPENDENT_AMBULATORY_CARE_PROVIDER_SITE_OTHER): Payer: Medicaid Other | Admitting: Family Medicine

## 2015-07-27 VITALS — BP 119/81 | HR 66 | Temp 97.5°F | Wt 159.8 lb

## 2015-07-27 DIAGNOSIS — O0993 Supervision of high risk pregnancy, unspecified, third trimester: Secondary | ICD-10-CM

## 2015-07-27 DIAGNOSIS — O24419 Gestational diabetes mellitus in pregnancy, unspecified control: Secondary | ICD-10-CM

## 2015-07-27 DIAGNOSIS — O09293 Supervision of pregnancy with other poor reproductive or obstetric history, third trimester: Secondary | ICD-10-CM

## 2015-07-27 DIAGNOSIS — O09523 Supervision of elderly multigravida, third trimester: Secondary | ICD-10-CM

## 2015-07-27 LAB — POCT URINALYSIS DIP (DEVICE)
BILIRUBIN URINE: NEGATIVE
GLUCOSE, UA: NEGATIVE mg/dL
Hgb urine dipstick: NEGATIVE
Ketones, ur: NEGATIVE mg/dL
LEUKOCYTES UA: NEGATIVE
NITRITE: NEGATIVE
Protein, ur: NEGATIVE mg/dL
Specific Gravity, Urine: 1.025 (ref 1.005–1.030)
UROBILINOGEN UA: 0.2 mg/dL (ref 0.0–1.0)
pH: 6 (ref 5.0–8.0)

## 2015-07-27 LAB — OB RESULTS CONSOLE GBS: STREP GROUP B AG: NEGATIVE

## 2015-07-27 NOTE — Patient Instructions (Signed)

## 2015-07-27 NOTE — Progress Notes (Signed)
Subjective:  Misty Gamble is a 39 y.o. [id number] at [redacted]w[redacted]d being seen today for ongoing prenatal care.  She is currently monitored for the following issues for this high-risk pregnancy: Patient Active Problem List   Diagnosis Date Noted  . Limited prenatal care 06/02/2015  . Gestational diabetes mellitus 06/02/2015  . Supervision of high risk pregnancy, antepartum 03/30/2015  . Advanced maternal age in multigravida 03/26/2015  . Hx of intrauterine fetal death, currently pregnant   . Aortic heart murmur on examination 08/19/2012  . Elevated liver enzymes 08/19/2012   Patient reports no complaints.  Contractions: Irregular. Vag. Bleeding: None.  Movement: Present. Denies leaking of fluid.  BS: 76-89 2hr bfast 75-103 2hr lunch 85-106 2hr dinner 103-120  The following portions of the patient's history were reviewed and updated as appropriate: allergies, current medications, past family history, past medical history, past social history, past surgical history and problem list. Problem list updated.  Objective:   Filed Vitals:   07/27/15 1605  BP: 119/81  Pulse: 66  Temp: 97.5 F (36.4 C)  Weight: 159 lb 12.8 oz (72.485 kg)    Fetal Status:     Movement: Present     General:  Alert, oriented and cooperative. Patient is in no acute distress.  Skin: Skin is warm and dry. No rash noted.   Cardiovascular: Normal heart rate noted  Respiratory: Normal respiratory effort, no problems with respiration noted  Abdomen: Soft, gravid, appropriate for gestational age. Pain/Pressure: Present     Pelvic: Vag. Bleeding: None     Cervical exam deferred        Extremities: Normal range of motion.  Edema: None  Mental Status: Normal mood and affect. Normal behavior. Normal judgment and thought content.   Urinalysis: Urine Protein: Negative Urine Glucose: Negative  Assessment and Plan:  Pregnancy: [id number] at [redacted]w[redacted]d  1. Hx of intrauterine fetal death, currently pregnant, third trimester -  Fetal nonstress test - IOL scheduled  2. Advanced maternal age in multigravida, third trimester  3. Gestational diabetes mellitus in third trimester, unspecified diabetic control -Good control, no need for medications -IOL scheduled  4. Supervision of high risk pregnancy, antepartum, third trimester -updated pregnancy box  Term labor symptoms and general obstetric precautions including but not limited to vaginal bleeding, contractions, leaking of fluid and fetal movement were reviewed in detail with the patient. Please refer to After Visit Summary for other counseling recommendations.  Return in about 1 week (around 08/03/2015) for Routine prenatal care.  Future Appointments Date Time Provider [place]  07/30/2015 1:15 PM [place] NST [facility] MFC-US  08/03/2015 11:00 AM WOC-WOCA NST WOC-WOCA WOC  08/06/2015 11:00 AM WOC-WOCA NST WOC-WOCA WOC  08/10/2015 6:30 AM WH-BSSCHED ROOM WH-BSSCHED None   Caren Macadam, MD

## 2015-07-28 ENCOUNTER — Telehealth (HOSPITAL_COMMUNITY): Payer: Self-pay | Admitting: *Deleted

## 2015-07-28 ENCOUNTER — Encounter (HOSPITAL_COMMUNITY): Payer: Self-pay | Admitting: *Deleted

## 2015-07-28 NOTE — Telephone Encounter (Signed)
Preadmission screen  

## 2015-07-28 NOTE — Telephone Encounter (Signed)
Interpreter number [phone]

## 2015-07-29 LAB — CULTURE, BETA STREP (GROUP B ONLY)

## 2015-07-30 ENCOUNTER — Other Ambulatory Visit (HOSPITAL_COMMUNITY): Payer: Medicaid Other

## 2015-08-03 ENCOUNTER — Encounter: Payer: Medicaid Other | Admitting: Student

## 2015-08-03 ENCOUNTER — Ambulatory Visit (INDEPENDENT_AMBULATORY_CARE_PROVIDER_SITE_OTHER): Payer: Medicaid Other | Admitting: Certified Nurse Midwife

## 2015-08-03 VITALS — BP 122/81 | HR 66 | Wt 163.5 lb

## 2015-08-03 DIAGNOSIS — O09293 Supervision of pregnancy with other poor reproductive or obstetric history, third trimester: Secondary | ICD-10-CM | POA: Diagnosis not present

## 2015-08-03 LAB — POCT URINALYSIS DIP (DEVICE)
Bilirubin Urine: NEGATIVE
Glucose, UA: NEGATIVE mg/dL
HGB URINE DIPSTICK: NEGATIVE
Ketones, ur: NEGATIVE mg/dL
LEUKOCYTES UA: NEGATIVE
NITRITE: NEGATIVE
PH: 6 (ref 5.0–8.0)
Protein, ur: NEGATIVE mg/dL
Specific Gravity, Urine: 1.025 (ref 1.005–1.030)
UROBILINOGEN UA: 0.2 mg/dL (ref 0.0–1.0)

## 2015-08-03 NOTE — Progress Notes (Signed)
Subjective:  Misty Gamble is a 39 y.o. [id number] at [redacted]w[redacted]d being seen today for ongoing prenatal care.  She is currently monitored for the following issues for this high-risk pregnancy and has Aortic heart murmur on examination; Elevated liver enzymes; Advanced maternal age in multigravida; Hx of intrauterine fetal death, currently pregnant; Supervision of high risk pregnancy, antepartum; Limited prenatal care; and Gestational diabetes mellitus on her problem list.  Patient reports no complaints.  Contractions: Irregular. Vag. Bleeding: None.  Movement: Present. Denies leaking of fluid.   The following portions of the patient's history were reviewed and updated as appropriate: allergies, current medications, past family history, past medical history, past social history, past surgical history and problem list. Problem list updated.  Objective:   Filed Vitals:   08/03/15 1322  BP: 122/81  Pulse: 66  Weight: 163 lb 8 oz (74.163 kg)    Fetal Status: Fetal Heart Rate (bpm): NST   Movement: Present     General:  Alert, oriented and cooperative. Patient is in no acute distress.  Skin: Skin is warm and dry. No rash noted.   Cardiovascular: Normal heart rate noted  Respiratory: Normal respiratory effort, no problems with respiration noted  Abdomen: Soft, gravid, appropriate for gestational age. Pain/Pressure: Present     Pelvic: Vag. Bleeding: None     Cervical exam deferred        Extremities: Normal range of motion.     Mental Status: Normal mood and affect. Normal behavior. Normal judgment and thought content.   Urinalysis:      Assessment and Plan:  Pregnancy: [id number] at [redacted]w[redacted]d  1. Hx of intrauterine fetal death, currently pregnant, third trimester NST Reactive Cat 1  - Amniotic fluid index with NST  Term labor symptoms and general obstetric precautions including but not limited to vaginal bleeding, contractions, leaking of fluid and fetal movement were reviewed in detail with the  patient. Please refer to After Visit Summary for other counseling recommendations.  Return in about 3 days (around 08/06/2015) for NST as scheduled.   Larey Days, CNM

## 2015-08-03 NOTE — Progress Notes (Signed)
IOL scheduled 12/6

## 2015-08-03 NOTE — Patient Instructions (Signed)
Induccin del trabajo de parto  (Labor Induction) Se denomina induccin del trabajo de parto cuando se inician acciones para hacer que una mujer embarazada comience el [place] de [place]. La [employer] de las mujeres comienzan el trabajo de parto sin ayuda entre las semanas 24 y 67 del Media planner. Cuando esto no ocurre o cuando hay una necesidad mdica, pueden utilizarse diferentes mtodos para inducirlo. La induccin del trabajo de parto hace que el tero se contraiga. Tambin hace que el cuello del tero se ablandemadure), se abra (se dilate), y se afine (se borre). Generalmente el trabajo de parto no se induce antes de las 39 semanas excepto que haya un problema con el beb o con la [place].  Antes de inducir el trabajo de parto, el mdico considerar cierto nmero de factores incluyendo los siguientes:  El estado del beb.  Cuntas semanas tiene de [place].  La madurez de los pulmones del beb.  El [place] del cuello del tero.  La posicin del beb. CULES SON LOS MOTIVOS PARA INDUCIR UN PARTO? El [place] de parto puede inducirse por las siguientes razones:  La salud del beb o de la madre estn en riesgo.  El embarazo se ha pasado de trmino en 1 semana o ms.  Ha roto la bolsa de aguas pero no se ha iniciado el trabajo de parto por s mismo.  La madre tiene algn trastorno de salud o una enfermedad grave, como hipertensin arterial, una infeccin, desprendimiento abrupto de la placenta o diabetes.  Hay escaso lquido amnitico alrededor del beb.  El beb presenta sufrimiento. La conveniencia o el deseo de que el beb nazca en una cierta fecha no es un motivo para inducir el [place]. CULES SON LOS MTODOS UTILIZADOS PARA INDUCIR EL TRABAJO DE PARTO? Algunos mtodos de induccin del [place] son:   Administracin del medicamentos prostaglandina. Este medicamento hace que el cuello uterino se dilate y [place]. Este medicamento tambin iniciar las contracciones. Puede tomarse por  boca o insertarse en la vagina en forma de supositorio.  Insercin en la vagina de un tubo delgado (catter) con un baln en el extremo para dilatar el cuello del tero. Una vez insertado, el baln se infla con agua, lo que provoca la apertura del cuello del tero.  Ruptura de las [place]. El mdico separa el saco amnitico del cuello uterino, haciendo que el cuello uterino se distienda y cause la liberacin de la hormona llamada progesterona. Esto hace que el tero se contraiga. Este procedimiento se realiza durante una visita al consultorio mdico. Le indicarn que vuelva a su casa y espere que se inicien las contracciones. Luego tendr que volver para la induccin.  Ruptura de la bolsa de aguas. El mdico romper el saco amnitico con un pequeo instrumento. Una vez que el saco amnitico se rompe, las Administrator, arts. Pueden pasar algunas horas hasta que [employer].  Medicamentos que desencadenen o [place] contracciones. Se lo administrarn a travs de un catter por va intravenosa (IV) que se inserta en una de las venas del brazo. Todos los mtodos de induccin, excepto la ruptura de [place], se Pharmacist, community hospital. La induccin se Public affairs consultant hospital, de modo que usted y el beb puedan ser controlados cuidadosamente.  [place]? Algunas inducciones pueden demorar entre 2 y 3 das. Generalmente lleva [employer], dependiendo del [place] del cuello del tero. Puede tomar ms tiempo si la induccin se realiza en etapas tempranas del Media planner o  es Associate Professor. Si han pasado 2 o 3 das y no se inicia el trabajo de [place], podrn enviarla a su casa o Film/video editor cesrea. CULES SON LOS RIESGOS ASOCIADOS CON LA INDUCCiN DEL TRABAJO DE PARTO? Algunos de los riesgos de la induccin son:   Cambios en la frecuencia cardaca fetal, por ejemplo los latidos son demasiado rpidos, o lentos, o errticos.  Riesgo de distrs  fetal.  Posibilidad de infeccin en la madre o el beb.  Aumento de la posibilidad de que sea necesaria una cesrea.  Ruptura (abrupcin) de la placenta del tero (raro).  Ruptura uterina (muy raro). Cuando es Chartered loss adjuster la induccin por razones mdicas, los beneficios deben superar a los [place]. CULES SON ALGUNAS RAZONES PARA NO INDUCIR EL TRABAJO DE PARTO? La induccin no debe realizarse si:   Se demuestra que el beb no tolera el trabajo de [place].  Fue sometida anteriormente a Engineer, agricultural, como una miomectoma o le han extirpado fibromas.  La placenta est en una posicin muy baja en el tero y obstruye la abertura del cuello (placenta previa).  El beb no est ubicado con la [employer].  El cordn umbilical cae hacia el canal de parto, adelante del beb. Esto puede cortar el suministro de [place] y oxgeno al beb.  Fue sometida a Information systems manager.  Hay circunstancias poco habituales, como que el beb es Insurance claims handler.   Esta informacin no tiene Marine scientist el consejo del mdico. Asegrese de hacerle al mdico cualquier pregunta que tenga.   Document Released: 11/28/2007 Document Revised: 09/11/2014 Elsevier Interactive Patient Education [employer].

## 2015-08-06 ENCOUNTER — Ambulatory Visit (INDEPENDENT_AMBULATORY_CARE_PROVIDER_SITE_OTHER): Payer: Medicaid Other | Admitting: General Practice

## 2015-08-06 VITALS — BP 122/75 | HR 70

## 2015-08-06 DIAGNOSIS — O24410 Gestational diabetes mellitus in pregnancy, diet controlled: Secondary | ICD-10-CM

## 2015-08-10 ENCOUNTER — Encounter (HOSPITAL_COMMUNITY): Payer: Self-pay

## 2015-08-10 ENCOUNTER — Inpatient Hospital Stay (HOSPITAL_COMMUNITY)
Admission: RE | Admit: 2015-08-10 | Discharge: 2015-08-13 | DRG: 775 | Disposition: A | Discharge status: Home / Self Care | Payer: Medicaid Other | Source: Ambulatory Visit | Attending: Family Medicine | Admitting: Family Medicine

## 2015-08-10 VITALS — BP 124/52 | HR 70 | Temp 98.8°F | Resp 20 | Ht 61.0 in | Wt 163.0 lb

## 2015-08-10 DIAGNOSIS — O09523 Supervision of elderly multigravida, third trimester: Secondary | ICD-10-CM

## 2015-08-10 DIAGNOSIS — Z3A39 39 weeks gestation of pregnancy: Secondary | ICD-10-CM

## 2015-08-10 DIAGNOSIS — O0993 Supervision of high risk pregnancy, unspecified, third trimester: Secondary | ICD-10-CM

## 2015-08-10 DIAGNOSIS — O24419 Gestational diabetes mellitus in pregnancy, unspecified control: Secondary | ICD-10-CM | POA: Diagnosis present

## 2015-08-10 DIAGNOSIS — O0930 Supervision of pregnancy with insufficient antenatal care, unspecified trimester: Secondary | ICD-10-CM

## 2015-08-10 DIAGNOSIS — O24420 Gestational diabetes mellitus in childbirth, diet controlled: Principal | ICD-10-CM | POA: Diagnosis present

## 2015-08-10 DIAGNOSIS — O26893 Other specified pregnancy related conditions, third trimester: Secondary | ICD-10-CM | POA: Diagnosis present

## 2015-08-10 DIAGNOSIS — O09529 Supervision of elderly multigravida, unspecified trimester: Secondary | ICD-10-CM

## 2015-08-10 DIAGNOSIS — O0990 Supervision of high risk pregnancy, unspecified, unspecified trimester: Secondary | ICD-10-CM

## 2015-08-10 DIAGNOSIS — O24429 Gestational diabetes mellitus in childbirth, unspecified control: Secondary | ICD-10-CM | POA: Diagnosis not present

## 2015-08-10 DIAGNOSIS — O09299 Supervision of pregnancy with other poor reproductive or obstetric history, unspecified trimester: Secondary | ICD-10-CM

## 2015-08-10 LAB — TYPE AND SCREEN
ABO/RH(D): O POS
Antibody Screen: NEGATIVE

## 2015-08-10 LAB — GLUCOSE, CAPILLARY
GLUCOSE-CAPILLARY: 134 mg/dL — AB (ref 65–99)
GLUCOSE-CAPILLARY: 96 mg/dL (ref 65–99)
Glucose-Capillary: 83 mg/dL (ref 65–99)
Glucose-Capillary: 76 mg/dL (ref 65–99)
Glucose-Capillary: 69 mg/dL (ref 65–99)

## 2015-08-10 LAB — CBC
HCT: 34.4 % — ABNORMAL LOW (ref 36.0–46.0)
HEMOGLOBIN: 12.1 g/dL (ref 12.0–15.0)
MCH: 31.7 pg (ref 26.0–34.0)
MCHC: 35.2 g/dL (ref 30.0–36.0)
MCV: 90.1 fL (ref 78.0–100.0)
PLATELETS: 142 10*3/uL — AB (ref 150–400)
RBC: 3.82 MIL/uL — AB (ref 3.87–5.11)
RDW: 13 % (ref 11.5–15.5)
WBC: 5.6 10*3/uL (ref 4.0–10.5)

## 2015-08-10 LAB — COMPREHENSIVE METABOLIC PANEL
ALBUMIN: 2.9 g/dL — AB (ref 3.5–5.0)
ALT: 13 U/L — ABNORMAL LOW (ref 14–54)
ANION GAP: 7 (ref 5–15)
AST: 25 U/L (ref 15–41)
Alkaline Phosphatase: 103 U/L (ref 38–126)
BUN: 9 mg/dL (ref 6–20)
CO2: 21 mmol/L — AB (ref 22–32)
Calcium: 8.4 mg/dL — ABNORMAL LOW (ref 8.9–10.3)
Chloride: 106 mmol/L (ref 101–111)
Creatinine, Ser: 0.73 mg/dL (ref 0.44–1.00)
GFR calc Af Amer: >60 mL/min (ref >60)
GFR calc non Af Amer: >60 mL/min (ref >60)
GLUCOSE: 78 mg/dL (ref 65–99)
POTASSIUM: 3.6 mmol/L (ref 3.5–5.1)
SODIUM: 134 mmol/L — AB (ref 135–145)
Total Bilirubin: 1 mg/dL (ref 0.3–1.2)
Total Protein: 6.2 g/dL — ABNORMAL LOW (ref 6.5–8.1)

## 2015-08-10 LAB — PROTEIN / CREATININE RATIO, URINE
Creatinine, Urine: 177 mg/dL
PROTEIN CREATININE RATIO: 0.08 mg/mg{creat} (ref 0.00–0.15)
Total Protein, Urine: 14 mg/dL

## 2015-08-10 LAB — ABO/RH: ABO/RH(D): O POS

## 2015-08-10 MED ORDER — OXYTOCIN 40 UNITS IN LACTATED RINGERS INFUSION - SIMPLE MED
1.0000 m[IU]/min | INTRAVENOUS | Status: DC
  Filled 2015-08-10: qty 1000

## 2015-08-10 MED ORDER — ZOLPIDEM TARTRATE 5 MG PO TABS: 5.0000 mg | ORAL_TABLET | Freq: Every evening | ORAL | Status: DC | PRN

## 2015-08-10 MED ORDER — TERBUTALINE SULFATE 1 MG/ML IJ SOLN
0.2500 mg | Freq: Once | INTRAMUSCULAR | Status: DC | PRN
  Filled 2015-08-10: qty 1

## 2015-08-10 MED ORDER — LACTATED RINGERS IV SOLN
500.0000 mL | INTRAVENOUS | Status: DC | PRN
  Administered 2015-08-11 (×2): 500 mL via INTRAVENOUS

## 2015-08-10 MED ORDER — OXYCODONE-ACETAMINOPHEN 5-325 MG PO TABS: 2.0000 | ORAL_TABLET | ORAL | Status: DC | PRN

## 2015-08-10 MED ORDER — LACTATED RINGERS IV SOLN
INTRAVENOUS | Status: DC
  Administered 2015-08-10: 125 mL/h via INTRAVENOUS

## 2015-08-10 MED ORDER — FENTANYL CITRATE (PF) 100 MCG/2ML IJ SOLN
50.0000 ug | INTRAMUSCULAR | Status: DC | PRN
  Administered 2015-08-11: 100 ug via INTRAVENOUS
  Filled 2015-08-10 (×2): qty 2

## 2015-08-10 MED ORDER — OXYTOCIN 40 UNITS IN LACTATED RINGERS INFUSION - SIMPLE MED: 62.5000 mL/h | INTRAVENOUS | Status: DC

## 2015-08-10 MED ORDER — LACTATED RINGERS IV SOLN
INTRAVENOUS | Status: DC
  Administered 2015-08-10 – 2015-08-11 (×2): via INTRAVENOUS

## 2015-08-10 MED ORDER — OXYCODONE-ACETAMINOPHEN 5-325 MG PO TABS: 1.0000 | ORAL_TABLET | ORAL | Status: DC | PRN

## 2015-08-10 MED ORDER — OXYTOCIN 40 UNITS IN LACTATED RINGERS INFUSION - SIMPLE MED
1.0000 m[IU]/min | INTRAVENOUS | Status: DC
  Administered 2015-08-10: 2 m[IU]/min via INTRAVENOUS

## 2015-08-10 MED ORDER — MISOPROSTOL 25 MCG QUARTER TABLET
25.0000 ug | ORAL_TABLET | ORAL | Status: DC | PRN
  Administered 2015-08-10: 25 ug via VAGINAL
  Filled 2015-08-10: qty 1
  Filled 2015-08-10: qty 0.25

## 2015-08-10 MED ORDER — OXYTOCIN BOLUS FROM INFUSION: 500.0000 mL | INTRAVENOUS | Status: DC

## 2015-08-10 MED ORDER — ONDANSETRON HCL 4 MG/2ML IJ SOLN: 4.0000 mg | Freq: Four times a day (QID) | INTRAMUSCULAR | Status: DC | PRN

## 2015-08-10 MED ORDER — CITRIC ACID-SODIUM CITRATE 334-500 MG/5ML PO SOLN: 30.0000 mL | ORAL | Status: DC | PRN

## 2015-08-10 MED ORDER — FLEET ENEMA 7-19 GM/118ML RE ENEM: 1.0000 | ENEMA | Freq: Every day | RECTAL | Status: DC | PRN

## 2015-08-10 MED ORDER — ACETAMINOPHEN 325 MG PO TABS
650.0000 mg | ORAL_TABLET | ORAL | Status: DC | PRN
  Administered 2015-08-10 – 2015-08-11 (×2): 650 mg via ORAL
  Filled 2015-08-10 (×2): qty 2

## 2015-08-10 NOTE — Progress Notes (Signed)
Patient ID: ZALIKA FEESER, female   DOB: 08-01-1976, 39 y.o.   MRN: [id number] Labor Progress Note DOMINEQUE BURGI is a 39 y.o. [id number] at [redacted]w[redacted]d presented for IOL secondary to hx 3rd trimester IUFD. She also has a history of A1GDM, blood sugars today have been wnl. She is contracting regularly with moderate discomfort. Foley bulb has been in place since 0930.  S:   O:  BP 132/85 mmHg  Pulse 76  Temp(Src) 97.6 F (36.4 C) (Oral)  Resp 20  Ht 5\' 1"  (1.549 m)  Wt 73.936 kg (163 lb)  BMI 30.81 kg/m2  LMP 11/10/2014 EFM: Reassuring, reactive, Cat 1  CVE: Dilation: 4 Effacement (%): 80 Cervical Position: Posterior Station: -3 Presentation: Vertex Exam by:: Delena Bali, MD   A&P: 39 y.o. [id number] [redacted]w[redacted]d here for IOL secondary to the above. Foley bulb is still in place, however she has progressed from 2 cm to 4 cm. Will hold off on additional cytotec dose at this time. Continue to monitor.  Stormy Card, MD 7:24 PM

## 2015-08-10 NOTE — H&P (Signed)
OBSTETRIC ADMISSION HISTORY AND PHYSICAL  Misty Gamble is a 39 y.o. female [id number] with IUP at [redacted]w[redacted]d by LMP presenting for IOL for hx 3rd trimester IUFD. She is also AMA and has A1GDM. She checks her sugars AM fasting and 2 hours postprandial, says her fasting is usually 70s and post prandial usually 110s. Her largest baby per chart review was 10lbs, however she reports 9.5lbs. She does not recall any difficulties with her previous deliveries. She reports +FMs, No LOF, no VB, no blurry vision, headaches or peripheral edema, and RUQ pain.  She plans on breast feeding. She requests Nexplanon for birth control.   Dating: By LMP --->  Estimated Date of Delivery: 08/17/15  Sono:  @34 .2w, CWD, normal anatomy, cephalic presentation, [id number] EFW, >97th% AC   Prenatal History/Complications:  [facility] Prenatal Labs  Dating L/8 Blood type: --/--/O POS (05/07 1619)   Genetic Screen  Quad: Neg  Antibody:Negative (07/07 0000)  Anatomic US wnl left fetal pyelectasis Rubella: Immune (07/07 0000)  GTT Early: Third trimester:  RPR: Nonreactive (07/07 0000)   Flu vaccine 06/02/15 HBsAg: Negative (07/07 0000)   TDaP vaccine 06/02/15 Rhogam: HIV: Non-reactive (07/07 0000)   Baby Food breast  GBS: (For PCN allergy, check sensitivities)  Contraception undecided, leaning towards Nexplanon Pap: wnl 12/2013  Circumcision declines   Pediatrician    Support Person        Past Medical History: Past Medical History  Diagnosis Date  . Aortic heart murmur on examination 08/19/2012    No previous Echo   . Elevated liver enzymes 08/19/2012  . Gestational diabetes     Past Surgical History: Past Surgical History  Procedure Laterality Date  . Cholecystectomy      Obstetrical History: OB History    Gravida Para Term Preterm AB TAB SAB Ectopic Multiple Living   9 6  5 1 2  2   5       Social History: Social History   Social History  . Marital Status: Married    Spouse Name: N/A  . Number of Children: N/A  . Years of Education: N/A   Social History Main Topics  . Smoking status: Never Smoker   . Smokeless tobacco: Never Used  . Alcohol Use: No  . Drug Use: No  . Sexual Activity: No   Other Topics Concern  . None   Social History Narrative    Family History: History reviewed. No pertinent family history.  Allergies: Allergies  Allergen Reactions  . Ciprofloxacin Rash    Prescriptions prior to admission  Medication Sig Dispense Refill Last Dose  . ACCU-CHEK FASTCLIX LANCETS MISC 1 Units by Does not apply route 4 (four) times daily. 102 each 3 Taking  . glucose blood (ACCU-CHEK AVIVA) test strip Use as instructed 100 each 12 Taking  . Prenatal Vit-Fe Fumarate-FA (PRENATAL MULTIVITAMIN) TABS tablet Take 1 tablet by mouth daily at 12 noon.   Taking     Review of Systems   All systems reviewed and negative except as stated in HPI  Last menstrual period 11/10/2014. General appearance: alert and cooperative Lungs: clear to auscultation bilaterally Heart: regular rate and rhythm Abdomen: soft, non-tender; bowel sounds normal Extremities: Homans sign is negative, no sign of DVT Presentation: cephalic  CVE: [id number] Fetal monitoringBaseline: 140 bpm, Variability: Good {> 6 bpm) and Accelerations: Reactive Uterine activityFrequency: Every 3-4 minutes, Duration: 60 seconds and Intensity: moderate     Prenatal labs: ABO, Rh: --/--/O POS (05/07 1619) Antibody: Negative (07/07 0000)  Rubella: !Error! IMMUNE RPR: NON REAC (09/28 1053)  HBsAg: Negative (07/07 0000)  HIV: NONREACTIVE (09/28 1053)  GBS: Negative (11/22 0000)  1 hr Glucola failed Genetic screening  NEGATIVE Anatomy US wnl left fetal pyelectasis  Prenatal Transfer Tool  Maternal Diabetes: Yes:  Diabetes Type:  Diet controlled Genetic Screening: Normal Maternal  Ultrasounds/Referrals: Normal Fetal Ultrasounds or other Referrals:  Other: Left hydronephrosis Maternal Substance Abuse:  No Significant Maternal Medications:  None Significant Maternal Lab Results: Lab values include: Group B Strep negative  No results found for this or any previous visit (from the past 24 hour(s)).  Patient Active Problem List   Diagnosis Date Noted  . Limited prenatal care 06/02/2015  . Gestational diabetes mellitus 06/02/2015  . Supervision of high risk pregnancy, antepartum 03/30/2015  . Advanced maternal age in multigravida 03/26/2015  . Hx of intrauterine fetal death, currently pregnant   . Aortic heart murmur on examination 08/19/2012  . Elevated liver enzymes 08/19/2012    Assessment: Misty Gamble is a 39 y.o. [id number] at [redacted]w[redacted]d here for IOL at [redacted] weeks gestation for previous history of 3rd trimester IUFD. She also has A1GDM and is AMA.    #Labor: Patient's cervix is favorable, however baby is high and not well engaged. Will place foley bulb and dose cytotec. Consider pitocin later in course.  #A1GDM: Glucose checks q4 while in latent labor. Can eat light thin liquid diet prior to pitocin starting.  #Pain: Epidural as needed #FWB: Cat 1 strip #ID:  GBS NEGATIVE, currently intact #MOF: Breast #MOC: Nexplanon #Circ:  Not desired #Fetal hydronephrosis: noted in prenatal transfer tool  Misty Gamble 08/10/2015, 7:47 AM  OB FELLOW HISTORY AND PHYSICAL ATTESTATION  I have seen and examined this patient; I agree with above documentation in the resident's note.    Misty Gamble 08/10/2015, 10:23 AM

## 2015-08-10 NOTE — Progress Notes (Signed)
EFM tracing maternal pt sitting up to eat 1810 to 1813

## 2015-08-10 NOTE — Progress Notes (Signed)
Confirmed with Dr Delena Bali that pt can order a light laboring diet

## 2015-08-10 NOTE — Progress Notes (Addendum)
Mikell, MD aware of SROM/foley bulb out spontaneously & current SVE, pitocin also started.

## 2015-08-10 NOTE — Progress Notes (Signed)
Labor Progress Note Misty Gamble is a 39 y.o. [id number] at [redacted]w[redacted]d presented for IOL secondary to her history of 3rd trimester IUFD. She also has a hx of A1GDM, macrosomia, and is AMA. She received a foley bulb this morning at 0930 and had cytotec placed at that time as well. She is feeling her contractions, which are q3 minutes.  S:   O:  BP 139/83 mmHg  Pulse 67  Temp(Src) 98.9 F (37.2 C) (Oral)  Resp 16  Ht 5\' 1"  (1.549 m)  Wt 73.936 kg (163 lb)  BMI 30.81 kg/m2  LMP 11/10/2014 EFM: reassuring, mod variability, reactive  CVE: Dilation: 2 Effacement (%):  (75) Cervical Position: Posterior Station: Ballotable Presentation: Vertex Exam by::  (shultz)   A&P: 39 y.o. [id number] [redacted]w[redacted]d with hx A1GDM undergoing induction for hx IUFD. She did not receive a second dose of cytotec at 1330 given strong contraction pattern. Will continue to monitor. No desire for epidural at this time. Foley still in place.   Stormy Card, MD 3:21 PM

## 2015-08-11 ENCOUNTER — Inpatient Hospital Stay (HOSPITAL_COMMUNITY): Payer: Medicaid Other | Admitting: Anesthesiology

## 2015-08-11 ENCOUNTER — Encounter (HOSPITAL_COMMUNITY): Payer: Self-pay

## 2015-08-11 DIAGNOSIS — O24429 Gestational diabetes mellitus in childbirth, unspecified control: Secondary | ICD-10-CM

## 2015-08-11 DIAGNOSIS — O09523 Supervision of elderly multigravida, third trimester: Secondary | ICD-10-CM

## 2015-08-11 LAB — GLUCOSE, CAPILLARY
GLUCOSE-CAPILLARY: 80 mg/dL (ref 65–99)
GLUCOSE-CAPILLARY: 108 mg/dL — AB (ref 65–99)
Glucose-Capillary: 74 mg/dL (ref 65–99)
Glucose-Capillary: 80 mg/dL (ref 65–99)
Glucose-Capillary: 180 mg/dL — ABNORMAL HIGH (ref 65–99)

## 2015-08-11 LAB — RPR: RPR: NONREACTIVE

## 2015-08-11 MED ORDER — MISOPROSTOL 200 MCG PO TABS
ORAL_TABLET | ORAL | Status: AC
  Filled 2015-08-11: qty 5

## 2015-08-11 MED ORDER — TETANUS-DIPHTH-ACELL PERTUSSIS 5-2.5-18.5 LF-MCG/0.5 IM SUSP: 0.5000 mL | Freq: Once | INTRAMUSCULAR | Status: DC

## 2015-08-11 MED ORDER — OXYTOCIN 40 UNITS IN LACTATED RINGERS INFUSION - SIMPLE MED: 1.0000 m[IU]/min | INTRAVENOUS | Status: DC

## 2015-08-11 MED ORDER — PHENYLEPHRINE 40 MCG/ML (10ML) SYRINGE FOR IV PUSH (FOR BLOOD PRESSURE SUPPORT)
PREFILLED_SYRINGE | INTRAVENOUS | Status: AC
  Filled 2015-08-11: qty 20

## 2015-08-11 MED ORDER — PRENATAL MULTIVITAMIN CH: 1.0000 | ORAL_TABLET | Freq: Every day | ORAL | Status: DC

## 2015-08-11 MED ORDER — SENNOSIDES-DOCUSATE SODIUM 8.6-50 MG PO TABS
2.0000 | ORAL_TABLET | ORAL | Status: DC
  Administered 2015-08-11 – 2015-08-12 (×2): 2 via ORAL
  Filled 2015-08-11 (×2): qty 2

## 2015-08-11 MED ORDER — FENTANYL 2.5 MCG/ML BUPIVACAINE 1/10 % EPIDURAL INFUSION (WH - ANES)
14.0000 mL/h | INTRAMUSCULAR | Status: DC | PRN
  Administered 2015-08-11 (×2): 14 mL/h via EPIDURAL
  Filled 2015-08-11: qty 125

## 2015-08-11 MED ORDER — WITCH HAZEL-GLYCERIN EX PADS: 1.0000 "application " | MEDICATED_PAD | CUTANEOUS | Status: DC | PRN

## 2015-08-11 MED ORDER — BENZOCAINE-MENTHOL 20-0.5 % EX AERO: 1.0000 "application " | INHALATION_SPRAY | CUTANEOUS | Status: DC | PRN

## 2015-08-11 MED ORDER — PHENYLEPHRINE 40 MCG/ML (10ML) SYRINGE FOR IV PUSH (FOR BLOOD PRESSURE SUPPORT)
80.0000 ug | PREFILLED_SYRINGE | INTRAVENOUS | Status: DC | PRN
  Filled 2015-08-11: qty 2

## 2015-08-11 MED ORDER — SIMETHICONE 80 MG PO CHEW: 80.0000 mg | CHEWABLE_TABLET | ORAL | Status: DC | PRN

## 2015-08-11 MED ORDER — IBUPROFEN 600 MG PO TABS
600.0000 mg | ORAL_TABLET | Freq: Four times a day (QID) | ORAL | Status: DC
  Administered 2015-08-11 – 2015-08-13 (×6): 600 mg via ORAL
  Filled 2015-08-11 (×6): qty 1

## 2015-08-11 MED ORDER — LANOLIN HYDROUS EX OINT: TOPICAL_OINTMENT | CUTANEOUS | Status: DC | PRN

## 2015-08-11 MED ORDER — DIPHENHYDRAMINE HCL 25 MG PO CAPS: 25.0000 mg | ORAL_CAPSULE | Freq: Four times a day (QID) | ORAL | Status: DC | PRN

## 2015-08-11 MED ORDER — LACTATED RINGERS IV SOLN: INTRAVENOUS | Status: DC

## 2015-08-11 MED ORDER — DIBUCAINE 1 % RE OINT: 1.0000 "application " | TOPICAL_OINTMENT | RECTAL | Status: DC | PRN

## 2015-08-11 MED ORDER — ZOLPIDEM TARTRATE 5 MG PO TABS: 5.0000 mg | ORAL_TABLET | Freq: Every evening | ORAL | Status: DC | PRN

## 2015-08-11 MED ORDER — FENTANYL 2.5 MCG/ML BUPIVACAINE 1/10 % EPIDURAL INFUSION (WH - ANES)
INTRAMUSCULAR | Status: AC
  Administered 2015-08-11 (×2): 14 mL/h via EPIDURAL
  Filled 2015-08-11: qty 125

## 2015-08-11 MED ORDER — PRENATAL MULTIVITAMIN CH
1.0000 | ORAL_TABLET | Freq: Every day | ORAL | Status: DC
  Administered 2015-08-12 – 2015-08-13 (×2): 1 via ORAL
  Filled 2015-08-11 (×2): qty 1

## 2015-08-11 MED ORDER — LACTATED RINGERS IV SOLN
INTRAVENOUS | Status: DC
  Administered 2015-08-11: 08:00:00 via INTRAUTERINE

## 2015-08-11 MED ORDER — OXYCODONE-ACETAMINOPHEN 5-325 MG PO TABS: 2.0000 | ORAL_TABLET | ORAL | Status: DC | PRN

## 2015-08-11 MED ORDER — LIDOCAINE HCL (PF) 1 % IJ SOLN
INTRAMUSCULAR | Status: DC | PRN
  Administered 2015-08-11: 8 mL
  Administered 2015-08-11: 8 mL via EPIDURAL

## 2015-08-11 MED ORDER — OXYCODONE-ACETAMINOPHEN 5-325 MG PO TABS: 1.0000 | ORAL_TABLET | ORAL | Status: DC | PRN

## 2015-08-11 MED ORDER — DIPHENHYDRAMINE HCL 50 MG/ML IJ SOLN: 12.5000 mg | INTRAMUSCULAR | Status: DC | PRN

## 2015-08-11 MED ORDER — ONDANSETRON HCL 4 MG PO TABS: 4.0000 mg | ORAL_TABLET | ORAL | Status: DC | PRN

## 2015-08-11 MED ORDER — EPHEDRINE 5 MG/ML INJ
10.0000 mg | INTRAVENOUS | Status: DC | PRN
Start: 2015-08-11 — End: 2015-08-11
  Filled 2015-08-11: qty 2

## 2015-08-11 MED ORDER — ACETAMINOPHEN 325 MG PO TABS: 650.0000 mg | ORAL_TABLET | ORAL | Status: DC | PRN

## 2015-08-11 MED ORDER — ONDANSETRON HCL 4 MG/2ML IJ SOLN: 4.0000 mg | INTRAMUSCULAR | Status: DC | PRN

## 2015-08-11 NOTE — Progress Notes (Signed)
Labor Progress Note Misty Gamble is a 39 y.o. [id number] at [redacted]w[redacted]d presented for IOL secondary to hx IUFD.   S: Patient is doing well after epidural. Using peanut ball to help with descent and edematous anterior cervical lip.   O:  BP 152/94 mmHg  Pulse 92  Temp(Src) 97.9 F (36.6 C) (Oral)  Resp 20  Ht 5\' 1"  (1.549 m)  Wt 163 lb (73.936 kg)  BMI 30.81 kg/m2  LMP 11/10/2014 EFM: 140/Mod/+accels/ Recurrent variables with nadir 60s with return to baseline and lasting < 2 minutes.  CVE: Dilation: Lip/rim Effacement (%): 100 (Thick anterior lip, edematous) Cervical Position: Anterior Station: +1 Presentation: Vertex Exam by:: Schultz   A&P: 39 y.o. [id number] [redacted]w[redacted]d undergoing IOL with foley bulb, cytotec, and intermittent pitocin.  #Labor: Continue current management. Continue Amnioinfusion and positional changes. Suspect malpresentation with possible OP vs asynclitic lie.   #Pain: Epidural  #FWB: Cat II, according to Parer et al 2007 there is no current risk of acidemia given this strep with moderate risk of evolution. Conservative measures are still warranted at this point.    Caren Macadam, MD 1:33 PM

## 2015-08-11 NOTE — Progress Notes (Signed)
Labor Progress Note FRAIDA RADEMACHER is a 39 y.o. [id number] at [redacted]w[redacted]d presented for IOL secondary to hx IUFD.   S: Patient now with epidural, but is feeling uncomfortable. Nursing has been working on aggressive position changes for anterior cervical lip.   O:  BP 153/121 mmHg  Pulse 87  Temp(Src) 97.6 F (36.4 C) (Oral)  Resp 20  Ht 5\' 1"  (1.549 m)  Wt 73.936 kg (163 lb)  BMI 30.81 kg/m2  SpO2 100%  LMP 11/10/2014 EFM: Repetitive deep variables, touching 50-70 bpm, moderate variability. Category 2  CVE: Dilation: Lip/rim Effacement (%): 100 (very edematous anterior lip) Cervical Position: Anterior Station: +1 Presentation: Vertex Exam by:: Adline Potter, MDs   A&P: 39 y.o. [id number] [redacted]w[redacted]d undergoing IOL with intermittent pitocin and laboring position changes.  #Labor: Continue low dose pitocin and progress by 1 as tolerated. Now on knees and leaning over back of the bed. Cannot reduce anterior cervical lip with contractions.  #Pain: Epidural #FWB: Category 2  #GBS: NEGATIVE  Stormy Card, MD 3:28 PM

## 2015-08-11 NOTE — Progress Notes (Signed)
  Patient is 39 y.o. [id number] [redacted]w[redacted]d admitted 08/10/15 for IOL for hx of IUFD. She also has a history of A1DM. Patient was initially induced with foley bulb as well as cytotec x 1. Foley fell out late last night and, with it, patient SROM at 23:40 and had IUPC placed. Around 4:00 this morning, baby developed prolonged late decel, so amnioinfusion initiated, which resolved late decels. Patient was then started on intermittent pitocin. Baby developed repeat variables, occasionally deep, touching 50-70 bpm, however baby had excellent recovery and beat to beat variability. Patient was able to progress to 9.5 cm with residual anterior cervical lip. Nursing rotated patient and did multiple position changes to help rotate baby's head, which was thought to be ROP. Patient developed increasing pelvic pressure and was able to push past anterior cervical lip immediately prior to delivery. Baby was delivered LOA over intact perineum and was found to have tight nuchal cord x1, unable to reduce at the perineum. Cord was reduced following delivery of the body. 81 cord was cut by MD and he was handed to NICU team for resuscitation.    Delivery Note At 4:42 PM a viable female was delivered via Vaginal, Spontaneous Delivery (Presentation: Left Occiput Anterior).  APGAR: 2, 8; weight 7lbs 4oz .   Placenta status: Intact, Spontaneous.  Cord: 3 vessels with the following complications: None.  Cord pH: 7.027  Anesthesia: Epidural  Episiotomy: None Lacerations: None Est. Blood Loss (mL):  150 cc  Mom to postpartum.  Baby to Couplet care / Skin to Skin.  Stormy Card 08/11/2015, 4:56 PM

## 2015-08-11 NOTE — Progress Notes (Signed)
LABOR PROGRESS NOTE  Misty Gamble is a 39 y.o. [id number] at [redacted]w[redacted]d  admitted for iol for hx iufd  Subjective: Moderately painful ctxns, no ha/vision change/ruq pain.  Objective: BP 108/55 mmHg  Pulse 63  Temp(Src) 98.3 F (36.8 C) (Oral)  Resp 20  Ht 5\' 1"  (1.549 m)  Wt 163 lb (73.936 kg)  BMI 30.81 kg/m2  LMP 11/10/2014 or  Filed Vitals:   08/11/15 0636 08/11/15 0700 08/11/15 0726 08/11/15 0731  BP: 107/64 105/60  108/55  Pulse: 61 59  63  Temp:   98.3 F (36.8 C)   TempSrc:   Oral   Resp:      Height:      Weight:       140/mod/+a/recurrent variables Dilation: 6 Effacement (%): 80 Cervical Position: Posterior Station: -1 Presentation: Vertex Exam by:: McSween, RN  Labs: Lab Results  Component Value Date   WBC 5.6 08/10/2015   HGB 12.1 08/10/2015   HCT 34.4* 08/10/2015   MCV 90.1 08/10/2015   PLT 142* 08/10/2015    Patient Active Problem List   Diagnosis Date Noted  . Limited prenatal care 06/02/2015  . Gestational diabetes mellitus 06/02/2015  . Supervision of high risk pregnancy, antepartum 03/30/2015  . Advanced maternal age in multigravida 03/26/2015  . Hx of intrauterine fetal death, currently pregnant   . Aortic heart murmur on examination 08/19/2012  . Elevated liver enzymes 08/19/2012    Assessment / Plan: 39 y.o. [id number] at [redacted]w[redacted]d here for iol for hx iufd. a1dm.  Labor: continue pitocin, iupc in place to ttirate Fetal Wellbeing:  Cat 2. Recurrent variables. Will start amnioinfusion. Pain Control:  Fentanyl prn Anticipated MOD:  Vaginal A1DM: glucose checks q4, most recent glucose wnl  Desma Maxim, MD 08/11/2015, 7:40 AM

## 2015-08-11 NOTE — Progress Notes (Addendum)
Labor Progress Note Misty Gamble is a 39 y.o. [id number] at [redacted]w[redacted]d presented for IOL due to IUFD hx 3rd trimester.  S: Patient doing well. She requested a 2 hour break before starting Pitocin.    O:  BP 145/86 mmHg  Pulse 68  Temp(Src) 98.1 F (36.7 C) (Oral)  Resp 20  Ht 5\' 1"  (1.549 m)  Wt 163 lb (73.936 kg)  BMI 30.81 kg/m2  LMP 11/10/2014 FHT:  FHR: 130 bpm, variability: moderate,  accelerations:  Present,  decelerations: None SROM @ 23:40, pinkish CVE: Dilation: 4 Effacement (%): 80 Cervical Position: Posterior Station: -3 Presentation: Vertex Exam by:: McSween, RN   A&P: 39 y.o. [id number] [redacted]w[redacted]d for IOL due to IUFD hx 3rd trimester.   #Labor: Started Pitocin @ 23:00. Foley Bulb out with SROM @ 23:40  #Pain: Fentanyl, Does not want an Epidural  #FWB:[place] I   Misty Coventry Criss Rosales, MD 1:06 AM

## 2015-08-11 NOTE — Anesthesia Procedure Notes (Signed)
Epidural Patient location during procedure: OB Start time: 08/11/2015 4:21 AM End time: 08/11/2015 4:25 AM  Staffing Anesthesiologist: Lyn Hollingshead Performed by: anesthesiologist   Preanesthetic Checklist Completed: patient identified, surgical consent, pre-op evaluation, timeout performed, IV checked, risks and benefits discussed and monitors and equipment checked  Epidural Patient position: sitting Prep: site prepped and draped and DuraPrep Patient monitoring: continuous pulse ox and blood pressure Approach: midline Location: L3-L4 Injection technique: LOR air  Needle:  Needle type: Tuohy  Needle gauge: 17 G Needle length: 9 cm and 9 Needle insertion depth: 6 cm Catheter type: closed end flexible Catheter size: 19 Gauge Catheter at skin depth: 10 cm Test dose: negative and Other  Assessment Sensory level: T9 Events: blood not aspirated, injection not painful, no injection resistance, negative IV test and no paresthesia  Additional Notes Reason for block:procedure for pain

## 2015-08-11 NOTE — Anesthesia Preprocedure Evaluation (Signed)
Anesthesia Evaluation  Patient identified by MRN, date of birth, ID band Patient awake    Reviewed: Allergy & Precautions, H&P , NPO status , Patient's Chart, lab work & pertinent test results  Airway Mallampati: I  TM Distance: >3 FB Neck ROM: full    Dental no notable dental hx.    Pulmonary neg pulmonary ROS,    Pulmonary exam normal        Cardiovascular negative cardio ROS Normal cardiovascular exam     Neuro/Psych negative neurological ROS  negative psych ROS   GI/Hepatic negative GI ROS, Neg liver ROS,   Endo/Other  diabetes, Gestational  Renal/GU negative Renal ROS     Musculoskeletal   Abdominal Normal abdominal exam  (+)   Peds  Hematology negative hematology ROS (+)   Anesthesia Other Findings   Reproductive/Obstetrics (+) Pregnancy                             Anesthesia Physical Anesthesia Plan  ASA: II  Anesthesia Plan: Epidural   Post-op Pain Management:    Induction:   Airway Management Planned:   Additional Equipment:   Intra-op Plan:   Post-operative Plan:   Informed Consent: I have reviewed the patients History and Physical, chart, labs and discussed the procedure including the risks, benefits and alternatives for the proposed anesthesia with the patient or authorized representative who has indicated his/her understanding and acceptance.     Plan Discussed with:   Anesthesia Plan Comments:         Anesthesia Quick Evaluation

## 2015-08-11 NOTE — Progress Notes (Signed)
Labor Progress Note Misty Gamble is a 39 y.o. [id number] at [redacted]w[redacted]d presented for IOL for hx IUFD.  S: Very uncomfortable, starting to feel pressure and desire to push.   O:  BP 119/71 mmHg  Pulse 79  Temp(Src) 97.6 F (36.4 C) (Oral)  Resp 20  Ht 5\' 1"  (1.549 m)  Wt 73.936 kg (163 lb)  BMI 30.81 kg/m2  SpO2 100%  LMP 11/10/2014 EFM: Unchanged from previous, moderate variability with good recovery after deep variables.   CVE: Dilation: Lip/rim Effacement (%): 100 (very edematous anterior lip) Cervical Position: Anterior Station: +1 Presentation: Vertex Exam by:: Adline Potter, MDs   A&P: 39 y.o. [id number] [redacted]w[redacted]d IOL for hx IUFD.  #Labor: Continue low dose pitocin. Unable to reduce anterior lip with pushes. The lip is thinner, however persistent. Will continue with position changes and close monitoring.   Stormy Card, MD 3:59 PM

## 2015-08-11 NOTE — Progress Notes (Signed)
Labor Progress Note Misty Gamble is a 39 y.o. [id number] at [redacted]w[redacted]d presented for IOL secondary to hx IUFD. Doing well, now with epidural and comfortable. She had foley bulb and cytotec overnight and had SROM after removal of foley bulb. Pitocin was started, however she then developed some late decels at 04:00, so pit was turned off. Amnioinfusion initiated and pit reinitiated, but turned off secondary to deep variables.   S:   O:  BP 115/79 mmHg  Pulse 72  Temp(Src) 97.9 F (36.6 C) (Oral)  Resp 20  Ht 5\' 1"  (1.549 m)  Wt 73.936 kg (163 lb)  BMI 30.81 kg/m2  LMP 11/10/2014 EFM: 140 baseline, moderate variability, continued variables and one late decel lasting 1.5min.   CVE: Dilation: 8 Effacement (%): 80 Cervical Position: Posterior Station: 0 Presentation: Vertex Exam by:: middleton rn   A&P: 39 y.o. [id number] [redacted]w[redacted]d undergoing IOL with foley bulb, cytotec, and intermittent pitocin.  #Labor: Continue current management. Amnioinfusion helped, however positional changes are most helpful with current resolution of variables. Large anterior lip, which we might be able to push through once she is nearly completely dilated.   #Pain: Epidural  #FWB: Good beat to beat variation, but monitoring closely given repetitive variables.   Stormy Card, MD 11:01 AM

## 2015-08-11 NOTE — Progress Notes (Signed)
Labor Progress Note CLO ALDI is a 39 y.o. [id number] at [redacted]w[redacted]d presented for IOL for h/o IUFD  S: Patient feeling more pressure after changes in position.   O:  BP 142/74 mmHg  Pulse 79  Temp(Src) 98.3 F (36.8 C) (Oral)  Resp 20  Ht 5\' 1"  (1.549 m)  Wt 163 lb (73.936 kg)  BMI 30.81 kg/m2  SpO2 100%  LMP 11/10/2014 EFM: 145/mod/+accels/ recurrent deep variables with return to baseline between.   CVE: Dilation: Lip/rim (anterior lip, smaller than previous exam) Effacement (%): 100 (very edematous anterior lip) Cervical Position: Anterior Station: +2 (when contracting) Presentation: Vertex Exam by:: Adline Potter, MDs   A&P: 39 y.o. [id number] [redacted]w[redacted]d here for IOL 2/2 to IUFD #Labor: Protracted 2nd stage. Continue current management with pitocin. Discussed protracted 1st stage and fetal strip. She agrees with proceeding with labor.  #Pain: s/p epidural #FWB: Cat II, increased risk of evolution given chronicity of variables and slightly decreasing variability.  However low risk of acidemia at this point #GBS  Neg #MOD: guarded, hopeful from NSVD.   Caren Macadam, MD 4:21 PM

## 2015-08-12 LAB — GLUCOSE, CAPILLARY
GLUCOSE-CAPILLARY: 68 mg/dL (ref 65–99)
GLUCOSE-CAPILLARY: 75 mg/dL (ref 65–99)

## 2015-08-12 LAB — CBC
HCT: 33.1 % — ABNORMAL LOW (ref 36.0–46.0)
Hemoglobin: 11.7 g/dL — ABNORMAL LOW (ref 12.0–15.0)
MCH: 32 pg (ref 26.0–34.0)
MCHC: 35.3 g/dL (ref 30.0–36.0)
MCV: 90.4 fL (ref 78.0–100.0)
PLATELETS: 124 10*3/uL — AB (ref 150–400)
RBC: 3.66 MIL/uL — AB (ref 3.87–5.11)
RDW: 13.2 % (ref 11.5–15.5)
WBC: 12.9 10*3/uL — ABNORMAL HIGH (ref 4.0–10.5)

## 2015-08-12 NOTE — Lactation Note (Signed)
This note was copied from the chart of [name]. Lactation Consultation Note Experienced BF mom has 5 boys and 1 girls, stated she had BF all of them 2-2 1/2 yrs. Has good evetrted nipples and denies difficulty latching. Reviewed basics, including STS, I&O, cluster feeding, and supply and demand. Mom has [place] will make f/u appt. [place] brochure given w/resources, support groups and [facility] services. Mom encouraged to feed baby 8-12 times/24 hours and with feeding cues. Mom encouraged to waken baby for feeds.  Patient Name: Misty Gamble Today's Date: 08/12/2015 Reason for consult: Initial assessment   Maternal Data Has patient been taught Hand Expression?: Yes Does the patient have breastfeeding experience prior to this delivery?: Yes  Feeding Feeding Type: Breast Fed Length of feed: 15 min  LATCH Score/Interventions Latch: Grasps breast easily, tongue down, lips flanged, rhythmical sucking.  Audible Swallowing: Spontaneous and intermittent  Type of Nipple: Everted at rest and after stimulation  Comfort (Breast/Nipple): Soft / non-tender     Hold (Positioning): No assistance needed to correctly position infant at breast. Intervention(s): Breastfeeding basics reviewed;Support Pillows;Position options;Skin to skin  LATCH Score: 10  Lactation Tools Discussed/Used WIC Program: Yes   Consult Status Consult Status: PRN Date: 08/12/15 Follow-up type: In-patient    Theodoro Kalata 08/12/2015, 4:42 AM

## 2015-08-12 NOTE — Lactation Note (Signed)
This note was copied from the chart of [place]. Lactation Consultation Note  Baby sleeping. Mother states breastfeeding is going well. Denies questions, concerns or soreness. Reviewed engorgement care and monitoring voids/stools.  Patient Name: Misty Gamble [id number] Date: 08/12/2015 Reason for consult: Follow-up assessment   Maternal Data    Feeding Feeding Type: Breast Fed Length of feed: 5 min  LATCH Score/Interventions                      Lactation Tools Discussed/Used     Consult Status Consult Status: Complete    Carlye Grippe 08/12/2015, 3:14 PM

## 2015-08-12 NOTE — Anesthesia Postprocedure Evaluation (Signed)
Anesthesia Post Note  Patient: KMORA KORELL  Procedure(s) Performed: * No procedures listed *  Patient location during evaluation: Mother Baby Anesthesia Type: Epidural Level of consciousness: awake and alert and oriented Pain management: pain level controlled Vital Signs Assessment: post-procedure vital signs reviewed and stable Respiratory status: nonlabored ventilation and spontaneous breathing Cardiovascular status: stable Postop Assessment: no headache, no backache, no signs of nausea or vomiting and adequate PO intake (patient up walking, no complaints) Anesthetic complications: no    Last Vitals:  Filed Vitals:   08/12/15 0000 08/12/15 0616  BP: 127/66 103/64  Pulse: 61 51  Temp: 36.6 C 36.6 C  Resp: 18 18    Last Pain:  Filed Vitals:   08/12/15 0617  PainSc: 5                  Daelin Haste

## 2015-08-12 NOTE — Progress Notes (Signed)
Postpartum Progress Note Medical Student  Post Partum Day 1. SVD@4 :42pm on 08/11/2015.   Subjective: [name] had no acute events overnight. She has been ambulating and been tolerating PO intake well. She will transition to full meals today and has ordered breakfast. Pain is well controlled with scheduled ibuprofen. Patient is also adequately urinating spontaneously and is passing gas, although has not had a BM since delivery. She states bleeding is well controlled. She does not have any questions.   Current Meds: Scheduled Meds: . ibuprofen  600 mg Oral 4 times per day  . prenatal multivitamin  1 tablet Oral Q1200  . senna-docusate  2 tablet Oral Q24H  . Tdap  0.5 mL Intramuscular Once   Continuous Infusions:  PRN Meds:.acetaminophen, benzocaine-Menthol, witch hazel-glycerin **AND** dibucaine, diphenhydrAMINE, lanolin, ondansetron **OR** ondansetron (ZOFRAN) IV, oxyCODONE-acetaminophen, oxyCODONE-acetaminophen, simethicone, zolpidem   Objective: Blood pressure 103/64, pulse 51, temperature 97.9 F (36.6 C), temperature source Oral, resp. rate 18, height 5\' 1"  (1.549 m), weight 73.936 kg (163 lb), last menstrual period 11/10/2014, SpO2 100 %, unknown if currently breastfeeding.  Physical Exam:  General: [name] is well appearing, pleasant and in no distress this morning on exam. Lochia: appropriate Uterine Fundus: firm Incision: N/A DVT Evaluation: No evidence of DVT seen on physical exam. Negative Homan's sign. No cords or calf tenderness. No significant calf/ankle edema.   Recent Labs  08/10/15 0740 08/12/15 0535  HGB 12.1 11.7*  HCT 34.4* 33.1*   Glucose, capillary: 08/11/15 : 10:00am 80 08/11/15 : 02:00pm 108 08/11/15 : 10:18pm 180 08/12/15 : 06:13am 75    Assessment/Plan: Plan for discharge tomorrow   LOS: 2 days   Thanh-TamT  Le 08/12/2015, 7:39 AM   CNM attestation Post Partum Day #1   Misty Gamble is a 39 y.o. [id number] s/p SVD.  Pt  denies problems with ambulating, voiding or po intake. Pain is well controlled.  Plan for birth control is undecided.  Method of Feeding: breast  PE:  BP 103/64 mmHg  Pulse 51  Temp(Src) 97.9 F (36.6 C) (Oral)  Resp 18  Ht 5\' 1"  (1.549 m)  Wt 73.936 kg (163 lb)  BMI 30.81 kg/m2  SpO2 100%  LMP 11/10/2014  Breastfeeding? Unknown Fundus firm  Plan for discharge: 08/13/15  Serita Grammes, CNM 7:53 AM  08/12/2015

## 2015-08-13 MED ORDER — IBUPROFEN 600 MG PO TABS: 600.0000 mg | ORAL_TABLET | Freq: Four times a day (QID) | ORAL | Status: DC

## 2015-08-13 NOTE — Discharge Instructions (Signed)

## 2015-08-13 NOTE — Progress Notes (Signed)
Post Partum Day 2 Subjective:  Misty Gamble is a 40 y.o. [id number] [redacted]w[redacted]d s/p SVD.  No acute events overnight.  Pt denies problems with ambulating, voiding or po intake.  She denies nausea or vomiting.  Pain is well controlled.  She has had flatus.  Lochia Minimal.  Plan for birth control is Nexplanon but will receive as outpatient.  Method of Feeding: Breast  Objective: Blood pressure 124/52, pulse 70, temperature 98.8 F (37.1 C), temperature source Oral, resp. rate 20, height 5\' 1"  (1.549 m), weight 73.936 kg (163 lb), last menstrual period 11/10/2014, SpO2 100 %, unknown if currently breastfeeding.  Physical Exam:  General: alert, cooperative and no distress Lochia:normal flow Chest: normal WOB Heart: Regular rate Abdomen: +BS, soft, mild TTP (appropriate) Uterine Fundus: firm, at the umbilicus DVT Evaluation: No evidence of DVT seen on physical exam. Extremities: no edema   Recent Labs  08/12/15 0535  HGB 11.7*  HCT 33.1*    Assessment/Plan:  ASSESSMENT: Misty Gamble is a 39 y.o. [id number] [redacted]w[redacted]d s/p SVD who continues to do well. She reports feeling much better than yesterday. Her only complaint this morning was abdominal cramping that is well controlled by ibuprofen.  Discharge home Continue routine PP care Breastfeeding support PRN  LOS: 3 days   Hoyle Barr 08/13/2015, 8:40 AM   I have seen and examined this patient and agree the above assessment.  Respiratory effort normal, lochia appropriate, legs negative,  pain level normal.  CRESENZO-DISHMAN,Ridhima Golberg 08/19/2015 6:09 PM

## 2015-08-13 NOTE — Discharge Summary (Signed)
OB Discharge Summary     Patient Name: Misty Gamble DOB: Mar 08, 1976 MRN: [id number]  Date of admission: 08/10/2015 Delivering MD: Stormy Card   Date of discharge: 08/13/2015  Admitting diagnosis: INDUCTION Intrauterine pregnancy: [redacted]w[redacted]d     Secondary diagnosis:  Active Problems:   Advanced maternal age in multigravida   Hx of intrauterine fetal death, currently pregnant   Supervision of high risk pregnancy, antepartum   Limited prenatal care   Gestational diabetes mellitus  Additional problems: none     Discharge diagnosis: Term Pregnancy Delivered                                                                                                Post partum procedures:none  Augmentation: Pitocin, Cytotec and Foley Balloon  Complications: None  Hospital course:  Induction of Labor With Vaginal Delivery   39 y.o. yo [id number] at [redacted]w[redacted]d was admitted to the hospital 08/10/2015 for induction of labor.  Indication for induction: Hx IUFD .  Patient had an uncomplicated labor course as follows: Membrane Rupture Time/Date: 11:40 PM ,08/10/2015   Intrapartum Procedures: Episiotomy: None [1]                                         Lacerations:  None [1]  Patient had delivery of a Viable infant.  Information for the patient's newborn:  [place], [place] [id number]  Delivery Method: Vaginal, Spontaneous Delivery (Filed from Delivery Summary)    08/11/2015  Details of delivery can be found in separate delivery note.  Patient had a routine postpartum course. Patient is discharged home No discharge date for patient encounter.    Physical exam  Filed Vitals:   08/12/15 0000 08/12/15 0616 08/12/15 1757 08/13/15 0558  BP: 127/66 103/64 140/71 124/52  Pulse: 61 51 62 70  Temp: 97.8 F (36.6 C) 97.9 F (36.6 C) 98.6 F (37 C) 98.8 F (37.1 C)  TempSrc: Oral Oral Oral Oral  Resp: 18 18 18 20   Height:      Weight:      SpO2:       General: alert, cooperative and no  distress Lochia: appropriate Uterine Fundus: firm Incision: N/A DVT Evaluation: No evidence of DVT seen on physical exam. Labs: Lab Results  Component Value Date   WBC 12.9* 08/12/2015   HGB 11.7* 08/12/2015   HCT 33.1* 08/12/2015   MCV 90.4 08/12/2015   PLT 124* 08/12/2015   CMP Latest Ref Rng 08/10/2015  Glucose 65 - 99 mg/dL 78  BUN 6 - 20 mg/dL 9  Creatinine 0.44 - 1.00 mg/dL 0.73  Sodium 135 - 145 mmol/L 134(L)  Potassium 3.5 - 5.1 mmol/L 3.6  Chloride 101 - 111 mmol/L 106  CO2 22 - 32 mmol/L 21(L)  Calcium 8.9 - 10.3 mg/dL 8.4(L)  Total Protein 6.5 - 8.1 g/dL 6.2(L)  Total Bilirubin 0.3 - 1.2 mg/dL 1.0  Alkaline Phos 38 - 126 U/L 103  AST 15 - 41 U/L 25  ALT 14 -  54 U/L 13(L)    Discharge instruction: per After Visit Summary and "Baby and Me Booklet".  After visit meds:    Medication List    TAKE these medications        ibuprofen 600 MG tablet  Commonly known as:  ADVIL,MOTRIN  Take 1 tablet (600 mg total) by mouth every 6 (six) hours.     prenatal multivitamin Tabs tablet  Take 1 tablet by mouth daily at 12 noon.        Diet: routine diet  Activity: Advance as tolerated. Pelvic rest for 6 weeks.   Outpatient follow up:4 weeks;  Follow up Appt: Future Appointments Date Time Provider [place]  09/17/2015 9:00 AM Myra Marijo Sanes, MD [place] [facility]   Postpartum contraception: Undecided; discussed methods; no sex until gets [facility]  Newborn Data: Live born female  Birth Weight: 7 lb 4.1 oz (3290 g) APGAR: 2, 8  Baby Feeding: Bottle and Breast Disposition:home with mother   08/13/2015 Wyvonnia Dusky, CNM

## 2015-09-17 ENCOUNTER — Ambulatory Visit: Payer: Medicaid Other | Admitting: Obstetrics & Gynecology

## 2015-10-15 ENCOUNTER — Ambulatory Visit (INDEPENDENT_AMBULATORY_CARE_PROVIDER_SITE_OTHER): Payer: Medicaid Other | Admitting: Obstetrics & Gynecology

## 2015-10-15 ENCOUNTER — Encounter: Payer: Self-pay | Admitting: Obstetrics & Gynecology

## 2015-10-15 NOTE — Progress Notes (Signed)
Patient ID: Misty Gamble, female   DOB: [birth year], 40 y.o.   MRN: [id number] Subjective:     Misty Gamble is a 40 y.o. female who presents for a postpartum visit. She is 9 weeks postpartum following a spontaneous vaginal delivery. I have fully reviewed the prenatal and intrapartum course. The delivery was at 44 gestational weeks. Outcome: spontaneous vaginal delivery. Anesthesia: epidural. Postpartum course has been unremarkable. Baby's course has been unremarkable. Baby is feeding by breast. Bleeding no bleeding. Bowel function is abnormal: constipation. Bladder function is normal. Patient is sexually active. Contraception method is coitus interruptus. Postpartum depression screening: negative.  The following portions of the patient's history were reviewed and updated as appropriate: allergies, current medications, past family history, past medical history, past social history, past surgical history and problem list.  Review of Systems Pertinent items are noted in HPI.   Objective:    BP 123/72 mmHg  Pulse 61  Temp(Src) 98.2 F (36.8 C) (Oral)  Wt 143 lb 1.6 oz (64.91 kg)  Breastfeeding? Yes  General:  alert, cooperative and no distress                 Vagina: not evaluated  Cervix:     Corpus: not examined              Assessment:     normal postpartum exam. Pap smear not done at today's visit.   Plan:    1. Contraception: condoms 2. Will call if she wants Rx contraception 3. Follow up as needed.    Woodroe Mode, MD 10/15/2015

## 2015-10-19 ENCOUNTER — Other Ambulatory Visit: Payer: Medicaid Other

## 2015-10-22 ENCOUNTER — Other Ambulatory Visit: Payer: Medicaid Other

## 2016-07-15 ENCOUNTER — Inpatient Hospital Stay (HOSPITAL_COMMUNITY)
Admission: AD | Admit: 2016-07-15 | Discharge: 2016-07-15 | Disposition: A | Discharge status: Home / Self Care | Payer: Medicaid Other | Source: Ambulatory Visit | Attending: Obstetrics and Gynecology | Admitting: Obstetrics and Gynecology

## 2016-07-15 ENCOUNTER — Encounter (HOSPITAL_COMMUNITY): Payer: Self-pay | Admitting: Certified Nurse Midwife

## 2016-07-15 ENCOUNTER — Inpatient Hospital Stay (HOSPITAL_COMMUNITY): Payer: Medicaid Other

## 2016-07-15 DIAGNOSIS — O021 Missed abortion: Secondary | ICD-10-CM

## 2016-07-15 DIAGNOSIS — O26892 Other specified pregnancy related conditions, second trimester: Secondary | ICD-10-CM | POA: Diagnosis not present

## 2016-07-15 DIAGNOSIS — Z9049 Acquired absence of other specified parts of digestive tract: Secondary | ICD-10-CM | POA: Diagnosis not present

## 2016-07-15 DIAGNOSIS — Z3A01 Less than 8 weeks gestation of pregnancy: Secondary | ICD-10-CM | POA: Diagnosis not present

## 2016-07-15 DIAGNOSIS — R102 Pelvic and perineal pain: Secondary | ICD-10-CM | POA: Diagnosis not present

## 2016-07-15 DIAGNOSIS — O3680X0 Pregnancy with inconclusive fetal viability, not applicable or unspecified: Secondary | ICD-10-CM

## 2016-07-15 LAB — URINALYSIS, ROUTINE W REFLEX MICROSCOPIC
BILIRUBIN URINE: NEGATIVE
Glucose, UA: NEGATIVE mg/dL
HGB URINE DIPSTICK: NEGATIVE
Ketones, ur: NEGATIVE mg/dL
Leukocytes, UA: NEGATIVE
Nitrite: NEGATIVE
PH: 6 (ref 5.0–8.0)
Protein, ur: NEGATIVE mg/dL
SPECIFIC GRAVITY, URINE: 1.01 (ref 1.005–1.030)

## 2016-07-15 NOTE — MAU Provider Note (Signed)
History   [id number] in with c/o abd pain x 3 days. Pain is bilaterial low and radiates  Around to back. Pt states she was told at [facility] she was 18 wks. But she is unsure of dates.  CSN: [id number]  Arrival date & time 07/15/16  1849   None     Chief Complaint  Patient presents with  . Abdominal Pain    HPI  Past Medical History:  Diagnosis Date  . Aortic heart murmur on examination 08/19/2012   No previous Echo   . Elevated liver enzymes 08/19/2012  . Gestational diabetes   . Gestational diabetes mellitus 06/02/2015   Versus pre-gestation as early 1-hr 150; 3-hour 97/109/162/130 (per phone contact with HD, actual 3-hour results to be sent)      Past Surgical History:  Procedure Laterality Date  . CHOLECYSTECTOMY      No family history on file.  Social History  Substance Use Topics  . Smoking status: Never Smoker  . Smokeless tobacco: Never Used  . Alcohol use No    OB History    Gravida Para Term Preterm AB Living   10 7 6 1 2 6    SAB TAB Ectopic Multiple Live Births   2     0 6      Review of Systems  Constitutional: Negative.   HENT: Negative.   Eyes: Negative.   Respiratory: Negative.   Cardiovascular: Negative.   Gastrointestinal: Positive for abdominal pain.  Endocrine: Negative.   Genitourinary: Negative.   Musculoskeletal: Negative.   Skin: Negative.   Allergic/Immunologic: Negative.   Neurological: Negative.   Hematological: Negative.   Psychiatric/Behavioral: Negative.     Allergies  Ciprofloxacin  Home Medications    BP 114/60 (BP Location: Left Arm)   Pulse 73   Temp 97.6 F (36.4 C) (Oral)   Resp 18   Physical Exam  Constitutional: She is oriented to person, place, and time. She appears well-developed and well-nourished.  HENT:  Head: Normocephalic.  Eyes: Pupils are equal, round, and reactive to light.  Neck: Normal range of motion.  Cardiovascular: Normal rate, regular rhythm, normal heart sounds and intact distal pulses.    Pulmonary/Chest: Effort normal and breath sounds normal.  Abdominal: Soft. Bowel sounds are normal.  Musculoskeletal: Normal range of motion.  Neurological: She is alert and oriented to person, place, and time. She has normal reflexes.  Skin: Skin is warm and dry.  Psychiatric: She has a normal mood and affect. Her behavior is normal. Judgment and thought content normal.    MAU Course  Procedures (including critical care time)  Labs Reviewed  URINALYSIS, [place] (NOT AT [facility])   No results found.   No diagnosis found.  Missed ab    MDM  Bedside US show probable early IUP but since there is such a discrepancy in dates I will get dating [place] to r/o ectopic and to date pregnancy. US shows non viable IUP at 6-7 wks with no FHR. Discussed findings with pt. She desires no intervention at this time and wants to take a wait and see attitude. Precautions given and pt verbalizes understanding. Will d/c home

## 2016-07-15 NOTE — MAU Note (Signed)
Pt states she is [redacted] week pregnant and having bilateral abdominal pain x3 days. Pt states she has N&V. Pt has no other complaints.

## 2016-07-15 NOTE — Discharge Instructions (Signed)
Aborto espontáneo  °(Miscarriage) °El aborto espontáneo es la pérdida de un bebé que no ha nacido (feto) antes de la semana 20 del embarazo. La mayor parte de estos abortos ocurre en los primeros 3 meses. En algunos casos ocurre antes de que la mujer sepa que está embarazada. También se denomina "aborto espontáneo" o "pérdida prematura del embarazo". El aborto espontáneo puede ser una experiencia que afecte emocionalmente a la persona. Converse con su médico si tiene dudas, cómo es el proceso de duelo, y sobre planes futuros de embarazo.  °CAUSAS  °· Algunos problemas cromosómicos pueden hacer imposible que el bebé se desarrolle normalmente. Los problemas con los genes o cromosomas del bebé son generalmente el resultado de errores que se producen, por casualidad, cuando el embrión se divide y crece. Estos problemas no se heredan de los padres. °· Infección en el cuello del útero.   °· Problemas hormonales.   °· Problemas en el cuello del útero, como tener un útero incompetente. Esto ocurre cuando los tejidos no son lo suficientemente fuertes como para contener el embarazo.   °· Problemas del útero, como un útero con forma anormal, los fibromas o anormalidades congénitas.   °· Ciertas enfermedades crónicas.   °· No fume, no beba alcohol, ni consuma drogas.   °· Traumatismos   °A veces, la causa es desconocida.  °SÍNTOMAS  °· Sangrado o manchado vaginal, con o sin cólicos o dolor. °· Dolor o cólicos en el abdomen o en la cintura. °· Eliminación de líquido, tejidos o coágulos grandes por la vagina. °DIAGNÓSTICO  °El médico le hará un examen físico. También le indicará una ecografía para confirmar el aborto. Es posible que se realicen análisis de sangre.  °TRATAMIENTO  °· En algunos casos el tratamiento no es necesario, si se eliminan naturalmente todos los tejidos embrionarios que se encontraban en el útero. Si el feto o la placenta quedan dentro del útero (aborto incompleto), pueden infectarse, los tejidos que quedan  pueden infectarse y deben retirarse. Generalmente se realiza un procedimiento de dilatación y curetaje (D y C). Durante el procedimiento de dilatación y curetaje, el cuello del útero se abre (dilata) y se retira cualquier resto de tejido fetal o placentario del útero. °· Si hay una infección, le recetarán antibióticos. Podrán recetarle otros medicamentos para reducir el tamaño del útero (contraerlo) si hay una mucho sangrado. °· Si su sangre es Rh negativa y su bebé es Rh positivo, usted necesitará la inyección de inmunoglobulina Rh. Esta inyección protegerá a los futuros bebés de tener problemas de compatibilidad Rh en futuros embarazos. °INSTRUCCIONES PARA EL CUIDADO EN EL HOGAR  °· El médico le indicará reposo en cama o le permitirá realizar actividades livianas. Vuelva a la actividad lentamente o según las indicaciones de su médico. °· Pídale a alguien que la ayude con las responsabilidades familiares y del hogar durante este tiempo.   °· Lleve un registro de la cantidad y la saturación de las toallas higiénicas que utiliza cada día. Anote esta información   °· No use tampones. No No se haga duchas vaginales ni tenga relaciones sexuales hasta que el médico la autorice.   °· Sólo tome medicamentos de venta libre o recetados para calmar el dolor o el malestar, según las indicaciones de su médico.   °· No tome aspirina. La aspirina puede ocasionar hemorragias.   °· Concurra puntualmente a las citas de control con el médico.   °· Si usted o su pareja tienen dificultades con el duelo, hable con su médico para buscar la ayuda psicológica que los ayude a enfrentar la pérdida   del embarazo. Permtase el tiempo suficiente de duelo antes de quedar embarazada nuevamente.  SOLICITE ATENCIN MDICA DE INMEDIATO SI:   Siente calambres intensos o dolor en la espalda o en el abdomen.  Tiene fiebre.  Elimina grandes cogulos de [place] (del tamao de una nuez o ms) o tejidos por la vagina. Guarde lo que ha eliminado para  que su mdico lo examine.   La hemorragia aumenta.   Margette Fast secrecin vaginal espesa y con mal olor.  Se siente mareada, dbil, o se desmaya.   Siente escalofros.  ASEGRESE DE QUE:   Comprende estas instrucciones.  Controlar su enfermedad.  Solicitar ayuda de inmediato si no mejora o si empeora.   Esta informacin no tiene Marine scientist el consejo del mdico. Asegrese de hacerle al mdico cualquier pregunta que tenga.   Document Released: 05/31/2005 Document Revised: 12/16/2012 Elsevier Interactive Patient Education [employer].

## 2016-07-24 ENCOUNTER — Emergency Department (HOSPITAL_COMMUNITY)
Admission: EM | Admit: 2016-07-24 | Discharge: 2016-07-24 | Disposition: A | Discharge status: Home / Self Care | Payer: Medicaid Other | Attending: Emergency Medicine | Admitting: Emergency Medicine

## 2016-07-24 ENCOUNTER — Emergency Department (HOSPITAL_COMMUNITY): Payer: Medicaid Other

## 2016-07-24 DIAGNOSIS — O021 Missed abortion: Secondary | ICD-10-CM | POA: Diagnosis not present

## 2016-07-24 DIAGNOSIS — O208 Other hemorrhage in early pregnancy: Secondary | ICD-10-CM | POA: Diagnosis present

## 2016-07-24 DIAGNOSIS — N939 Abnormal uterine and vaginal bleeding, unspecified: Secondary | ICD-10-CM

## 2016-07-24 LAB — CBC
HCT: 37.3 % (ref 36.0–46.0)
Hemoglobin: 13.1 g/dL (ref 12.0–15.0)
MCH: 31.3 pg (ref 26.0–34.0)
MCHC: 35.1 g/dL (ref 30.0–36.0)
MCV: 89 fL (ref 78.0–100.0)
PLATELETS: 230 10*3/uL (ref 150–400)
RBC: 4.19 MIL/uL (ref 3.87–5.11)
RDW: 12.7 % (ref 11.5–15.5)
WBC: 6.5 10*3/uL (ref 4.0–10.5)

## 2016-07-24 LAB — WET PREP, GENITAL
CLUE CELLS WET PREP: NONE SEEN
Sperm: NONE SEEN
Trich, Wet Prep: NONE SEEN
Yeast Wet Prep HPF POC: NONE SEEN

## 2016-07-24 LAB — HCG, QUANTITATIVE, PREGNANCY: HCG, BETA CHAIN, QUANT, S: 9939 m[IU]/mL — AB (ref <5)

## 2016-07-24 LAB — ABO/RH: ABO/RH(D): O POS

## 2016-07-24 MED ORDER — MISOPROSTOL 200 MCG PO TABS: 1000.0000 ug | ORAL_TABLET | Freq: Every day | ORAL | 0 refills | Status: DC

## 2016-07-24 MED ORDER — NAPROXEN 500 MG PO TABS: 500.0000 mg | ORAL_TABLET | Freq: Two times a day (BID) | ORAL | 0 refills | Status: DC

## 2016-07-24 NOTE — ED Triage Notes (Signed)
Pt reports having vaginal bleeding that started 1 hour ago. Pt states blood is bright red and she had same issue with last pregnancy. Pt reports being [redacted] weeks pregnant gravada 7 para 6

## 2016-07-24 NOTE — ED Notes (Signed)
Pt transported to Ultrasound.  

## 2016-07-24 NOTE — ED Notes (Signed)
Pt ambulated to restroom. 

## 2016-07-24 NOTE — Discharge Instructions (Signed)
Insert 5 tablets of Cytotec in your vagina once to induce passage of "products of conception". If you have no bleeding or passage of tissue, repeat insertion of Cytotec vaginally the following day. You may experience some cramping or heavy bleeding. Cramping should be well managed with Tylenol or ibuprofen. You may have heavy bleeding for approximately 2 hours. If your bleeding requires you to change a pad more than once per hour for 5-6 hours, go to [facility] for evaluation. If you have no bleeding or passage of tissue after 2 doses of Cytotec, go to [facility] for evaluation. [facility] with [facility] weeks if, after Cytotec, you have no severe pain, abnormally heavy bleeding, and if you pass your "products of conception".   Inserte 5 tabletas de Cytotec en su vagina una vez para inducir el paso de los "productos de la concepcin". Si no tiene sangrado o paso de tejido, repita la insercin de Cytotec por va vaginal al da siguiente. Puede experimentar calambres o sangrado abundante. Los clicos deben manejarse bien con Tylenol o ibuprofeno. Puede tener un sangrado abundante durante aproximadamente 2 horas. Si su hemorragia requiere que cambie una almohadilla ms de una vez por hora durante 5-6 horas, [place] para su evaluacin. Si no tiene sangrado o paso de tejido despus de 2 dosis de Cytotec, vaya al [facility] para su evaluacin. Realice un seguimiento con la [facility] para mujeres en 2 semanas si, despus de Cytotec, no presenta dolor intenso, sangrado anormalmente intenso y si pasa sus "productos de la concepcin".

## 2016-07-24 NOTE — ED Provider Notes (Signed)
[place] DEPT Provider Note   CSN: [id number] Arrival date & time: 07/24/16  0219    History   Chief Complaint Chief Complaint  Patient presents with  . Vaginal Bleeding    HPI Misty Gamble is a 40 y.o. female.  Patient is a G41P6 female who presents to the emergency department for vaginal bleeding. She reports vaginal bleeding beginning shortly after midnight. She states that the bleeding has been light. She has not required more than one maxi pad for management. She denies passing any large clots or tissue. She has had no significant abdominal pain. No recent fevers or vomiting. She does have some mild nausea, but she attributes this to pregnancy. The patient presented to [facility] one week ago for complaints of abdominal pain. She had an ultrasound at this time which was consistent with a missed abortion. She states that she does not believe the results of this ultrasound and she "believes in God" for everything to be OK. She states that she has had bleeding with prior pregnancies without incident. No additional complaints for this visit.   The history is provided by the patient. No language interpreter was used.  Vaginal Bleeding  Primary symptoms include vaginal bleeding.    Past Medical History:  Diagnosis Date  . Aortic heart murmur on examination 08/19/2012   No previous Echo   . Elevated liver enzymes 08/19/2012  . Gestational diabetes   . Gestational diabetes mellitus 06/02/2015   Versus pre-gestation as early 1-hr 150; 3-hour 97/109/162/130 (per phone contact with HD, actual 3-hour results to be sent)      Patient Active Problem List   Diagnosis Date Noted  . Aortic heart murmur on examination 08/19/2012    Past Surgical History:  Procedure Laterality Date  . CHOLECYSTECTOMY      OB History    Gravida Para Term Preterm AB Living   10 6 6  0 3 6   SAB TAB Ectopic Multiple Live Births   3     0 6       Home Medications    Prior to  Admission medications   Medication Sig Start Date End Date Taking? Authorizing Provider  Prenatal Vit-Fe Fumarate-FA (PRENATAL MULTIVITAMIN) TABS tablet Take 1 tablet by mouth daily at 12 noon.   Yes Historical Provider, MD  misoprostol (CYTOTEC) 200 MCG tablet Place 5 tablets (1,000 mcg total) vaginally daily. 07/24/16   Antonietta Breach, PA-C  naproxen (NAPROSYN) 500 MG tablet Take 1 tablet (500 mg total) by mouth 2 (two) times daily. 07/24/16   Antonietta Breach, PA-C    Family History No family history on file.  Social History Social History  Substance Use Topics  . Smoking status: Never Smoker  . Smokeless tobacco: Never Used  . Alcohol use No     Allergies   Ciprofloxacin   Review of Systems Review of Systems  Genitourinary: Positive for vaginal bleeding.  Ten systems reviewed and are negative for acute change, except as noted in the HPI.    Physical Exam Updated Vital Signs BP 113/70   Pulse 65   Temp 97.5 F (36.4 C) (Oral)   Resp 20   SpO2 100%   Physical Exam  Constitutional: She is oriented to person, place, and time. She appears well-developed and well-nourished. No distress.  Nontoxic and in no distress. Pleasant.  HENT:  Head: Normocephalic and atraumatic.  Eyes: Conjunctivae and EOM are normal. No scleral icterus.  Neck: Normal range of motion.  Cardiovascular: Normal rate,  regular rhythm and intact distal pulses.   Pulmonary/Chest: Effort normal. No respiratory distress. She has no wheezes. She has no rales.  Respirations even and unlabored. Lungs clear.  Abdominal: Soft. She exhibits no distension and no mass. There is no tenderness. There is no guarding.  Soft, nontender abdomen. No masses. No distension.  Musculoskeletal: Normal range of motion.  Neurological: She is alert and oriented to person, place, and time. She exhibits normal muscle tone. Coordination normal.  Skin: Skin is warm and dry. No rash noted. She is not diaphoretic. No erythema. No pallor.    Psychiatric: She has a normal mood and affect. Her behavior is normal.  Nursing note and vitals reviewed.    ED Treatments / Results  Labs (all labs ordered are listed, but only abnormal results are displayed) Labs Reviewed  WET PREP, GENITAL - Abnormal; Notable for the following:       Result Value   WBC, Wet Prep HPF POC MANY (*)    All other components within normal limits  HCG, QUANTITATIVE, PREGNANCY - Abnormal; Notable for the following:    hCG, Beta Chain, Quant, S 9,939 (*)    All other components within normal limits  CBC  ABO/RH    EKG  EKG Interpretation None       Radiology US Ob Comp Less 14 Wks  Result Date: 07/24/2016 CLINICAL DATA:  Vaginal bleeding since 1 a.m. Estimated gestational age by LMP is 19 weeks 6 days. Quantitative beta HCG is 9,939. No comparison available. EXAM: OBSTETRIC <14 WK [place] AND TRANSVAGINAL OB US TECHNIQUE: Both transabdominal and transvaginal ultrasound examinations were performed for complete evaluation of the gestation as well as the maternal uterus, adnexal regions, and pelvic cul-de-sac. Transvaginal technique was performed to assess early pregnancy. COMPARISON:  07/15/2016 FINDINGS: Intrauterine gestational sac: A single intrauterine gestational sac is present. Yolk sac:  Yolk sac is visualized. Embryo:  Fetal pole is identified. Cardiac Activity: Fetal cardiac activity is not identified. CRL:  7.2  Mm   6 w   4 d Subchorionic hemorrhage:  None visualized. Maternal uterus/adnexae: Uterus is anteverted. No myometrial mass lesions identified. The right ovary is visualized and appears normal. Left ovary is poorly identified but no abnormal adnexal masses are seen on the left. No free fluid in the pelvis. IMPRESSION: Single intrauterine pregnancy. Estimated gestational age by crown-rump length is 6 weeks 4 days, demonstrating no growth since the previous study and discordant with maternal clinical dates. No fetal cardiac activity is observed.  Findings meet definitive criteria for failed pregnancy. This follows SRU consensus guidelines: Diagnostic Criteria for Nonviable Pregnancy Early in the First Trimester. Alison Stalling J Med [phone]. Electronically Signed   By: Lucienne Capers M.D.   On: 07/24/2016 06:06   US Ob Transvaginal  Result Date: 07/24/2016 CLINICAL DATA:  Vaginal bleeding since 1 a.m. Estimated gestational age by LMP is 19 weeks 6 days. Quantitative beta HCG is 9,939. No comparison available. EXAM: OBSTETRIC <14 WK [place] AND TRANSVAGINAL OB US TECHNIQUE: Both transabdominal and transvaginal ultrasound examinations were performed for complete evaluation of the gestation as well as the maternal uterus, adnexal regions, and pelvic cul-de-sac. Transvaginal technique was performed to assess early pregnancy. COMPARISON:  07/15/2016 FINDINGS: Intrauterine gestational sac: A single intrauterine gestational sac is present. Yolk sac:  Yolk sac is visualized. Embryo:  Fetal pole is identified. Cardiac Activity: Fetal cardiac activity is not identified. CRL:  7.2  Mm   6 w   4 d Subchorionic hemorrhage:  None visualized. Maternal uterus/adnexae: Uterus is anteverted. No myometrial mass lesions identified. The right ovary is visualized and appears normal. Left ovary is poorly identified but no abnormal adnexal masses are seen on the left. No free fluid in the pelvis. IMPRESSION: Single intrauterine pregnancy. Estimated gestational age by crown-rump length is 6 weeks 4 days, demonstrating no growth since the previous study and discordant with maternal clinical dates. No fetal cardiac activity is observed. Findings meet definitive criteria for failed pregnancy. This follows SRU consensus guidelines: Diagnostic Criteria for Nonviable Pregnancy Early in the First Trimester. Alison Stalling J Med [phone]. Electronically Signed   By: Lucienne Capers M.D.   On: 07/24/2016 06:06    Procedures Procedures (including critical care time)  Medications  Ordered in ED Medications - No data to display   Initial Impression / Assessment and Plan / ED Course  I have reviewed the triage vital signs and the nursing notes.  Pertinent labs & imaging results that were available during my care of the patient were reviewed by me and considered in my medical decision making (see chart for details).  Clinical Course     40 year old female presents to the emergency department for vaginal bleeding. She was seen at [facility] on 07/15/2016 with evidence of missed abortion on ultrasound. These findings have persisted today. There is been no fetal growth. No evidence of cardiac activity. HCG does not correlate with fetal age on ultrasound. Case discussed with Dr. Kennon Rounds of [facility] including hCG level, OB Ultrasound results, Rh positive, and stable hemoglobin. Dr. Kennon Rounds recommends induction with Cytotec 1072mcg vaginally q 24 hours x 2 with outpatient follow up at [facility] in 2 weeks. Patient advised to go to [facility] sooner if she has abnormally heavy bleeding or if she does not pass products of conception after 2 doses of Cytotec. Return precautions discussed and provided. Patient discharged in stable condition with no unaddressed concerns.   Final Clinical Impressions(s) / ED Diagnoses   Final diagnoses:  Missed abortion    New Prescriptions New Prescriptions   MISOPROSTOL (CYTOTEC) 200 MCG TABLET    Place 5 tablets (1,000 mcg total) vaginally daily.   NAPROXEN (NAPROSYN) 500 MG TABLET    Take 1 tablet (500 mg total) by mouth 2 (two) times daily.     Antonietta Breach, PA-C 07/24/16 [id number]    April Palumbo, MD 07/24/16 [phone]

## 2016-07-24 NOTE — ED Notes (Signed)
PA at bedside.

## 2016-08-02 ENCOUNTER — Inpatient Hospital Stay (EMERGENCY_DEPARTMENT_HOSPITAL)
Admission: AD | Admit: 2016-08-02 | Discharge: 2016-08-02 | Disposition: A | Discharge status: Home / Self Care | Payer: Medicaid Other | Source: Ambulatory Visit | Attending: Obstetrics & Gynecology | Admitting: Obstetrics & Gynecology

## 2016-08-02 ENCOUNTER — Other Ambulatory Visit: Payer: Self-pay

## 2016-08-02 ENCOUNTER — Emergency Department (HOSPITAL_COMMUNITY): Payer: Medicaid Other

## 2016-08-02 ENCOUNTER — Emergency Department (HOSPITAL_COMMUNITY)
Admission: EM | Admit: 2016-08-02 | Discharge: 2016-08-02 | Disposition: A | Discharge status: Home / Self Care | Payer: Medicaid Other | Attending: Emergency Medicine | Admitting: Emergency Medicine

## 2016-08-02 ENCOUNTER — Encounter (HOSPITAL_COMMUNITY): Payer: Self-pay

## 2016-08-02 DIAGNOSIS — Z9049 Acquired absence of other specified parts of digestive tract: Secondary | ICD-10-CM | POA: Insufficient documentation

## 2016-08-02 DIAGNOSIS — R55 Syncope and collapse: Secondary | ICD-10-CM

## 2016-08-02 DIAGNOSIS — I951 Orthostatic hypotension: Secondary | ICD-10-CM

## 2016-08-02 DIAGNOSIS — Z888 Allergy status to other drugs, medicaments and biological substances status: Secondary | ICD-10-CM

## 2016-08-02 DIAGNOSIS — Z3A01 Less than 8 weeks gestation of pregnancy: Secondary | ICD-10-CM | POA: Diagnosis not present

## 2016-08-02 DIAGNOSIS — K219 Gastro-esophageal reflux disease without esophagitis: Secondary | ICD-10-CM | POA: Diagnosis not present

## 2016-08-02 DIAGNOSIS — O039 Complete or unspecified spontaneous abortion without complication: Secondary | ICD-10-CM | POA: Diagnosis not present

## 2016-08-02 DIAGNOSIS — Z79899 Other long term (current) drug therapy: Secondary | ICD-10-CM

## 2016-08-02 DIAGNOSIS — O021 Missed abortion: Secondary | ICD-10-CM | POA: Insufficient documentation

## 2016-08-02 DIAGNOSIS — R1013 Epigastric pain: Secondary | ICD-10-CM | POA: Insufficient documentation

## 2016-08-02 LAB — CBC
HCT: 35.9 % — ABNORMAL LOW (ref 36.0–46.0)
HEMATOCRIT: 34.7 % — AB (ref 36.0–46.0)
Hemoglobin: 12.9 g/dL (ref 12.0–15.0)
Hemoglobin: 12 g/dL (ref 12.0–15.0)
MCH: 31 pg (ref 26.0–34.0)
MCH: 30.4 pg (ref 26.0–34.0)
MCHC: 35.9 g/dL (ref 30.0–36.0)
MCHC: 34.6 g/dL (ref 30.0–36.0)
MCV: 86.3 fL (ref 78.0–100.0)
MCV: 87.8 fL (ref 78.0–100.0)
PLATELETS: 193 10*3/uL (ref 150–400)
Platelets: 162 10*3/uL (ref 150–400)
RBC: 4.16 MIL/uL (ref 3.87–5.11)
RBC: 3.95 MIL/uL (ref 3.87–5.11)
RDW: 12.8 % (ref 11.5–15.5)
RDW: 12.5 % (ref 11.5–15.5)
WBC: 8.3 10*3/uL (ref 4.0–10.5)
WBC: 6.5 10*3/uL (ref 4.0–10.5)

## 2016-08-02 LAB — URINALYSIS, ROUTINE W REFLEX MICROSCOPIC
BILIRUBIN URINE: NEGATIVE
GLUCOSE, UA: NEGATIVE mg/dL
KETONES UR: NEGATIVE mg/dL
Leukocytes, UA: NEGATIVE
Nitrite: NEGATIVE
PH: 7 (ref 5.0–8.0)
Protein, ur: NEGATIVE mg/dL
Specific Gravity, Urine: 1.008 (ref 1.005–1.030)

## 2016-08-02 LAB — URINE MICROSCOPIC-ADD ON

## 2016-08-02 LAB — HCG, QUANTITATIVE, PREGNANCY: hCG, Beta Chain, Quant, S: 1843 m[IU]/mL — ABNORMAL HIGH (ref <5)

## 2016-08-02 LAB — BASIC METABOLIC PANEL
Anion gap: 9 (ref 5–15)
BUN: 10 mg/dL (ref 6–20)
CHLORIDE: 106 mmol/L (ref 101–111)
CO2: 22 mmol/L (ref 22–32)
Calcium: 8.9 mg/dL (ref 8.9–10.3)
Creatinine, Ser: 0.62 mg/dL (ref 0.44–1.00)
GFR calc Af Amer: >60 mL/min (ref >60)
GLUCOSE: 138 mg/dL — AB (ref 65–99)
POTASSIUM: 3.4 mmol/L — AB (ref 3.5–5.1)
Sodium: 137 mmol/L (ref 135–145)

## 2016-08-02 LAB — HEPATIC FUNCTION PANEL
ALBUMIN: 3.7 g/dL (ref 3.5–5.0)
ALT: 56 U/L — AB (ref 14–54)
AST: 91 U/L — ABNORMAL HIGH (ref 15–41)
Alkaline Phosphatase: 62 U/L (ref 38–126)
BILIRUBIN INDIRECT: 0.6 mg/dL (ref 0.3–0.9)
Bilirubin, Direct: 0.2 mg/dL (ref 0.1–0.5)
TOTAL PROTEIN: 6.2 g/dL — AB (ref 6.5–8.1)
Total Bilirubin: 0.8 mg/dL (ref 0.3–1.2)

## 2016-08-02 LAB — TROPONIN I: Troponin I: <0.03 ng/mL (ref <0.03)

## 2016-08-02 LAB — LIPASE, BLOOD: LIPASE: 41 U/L (ref 11–51)

## 2016-08-02 MED ORDER — FAMOTIDINE IN NACL 20-0.9 MG/50ML-% IV SOLN
20.0000 mg | Freq: Once | INTRAVENOUS | Status: AC
  Administered 2016-08-02: 20 mg via INTRAVENOUS
  Filled 2016-08-02: qty 50

## 2016-08-02 MED ORDER — TRAMADOL HCL 50 MG PO TABS
100.0000 mg | ORAL_TABLET | Freq: Once | ORAL | Status: AC
  Administered 2016-08-02: 100 mg via ORAL
  Filled 2016-08-02: qty 2

## 2016-08-02 MED ORDER — PANTOPRAZOLE SODIUM 20 MG PO TBEC: 20.0000 mg | DELAYED_RELEASE_TABLET | Freq: Two times a day (BID) | ORAL | 0 refills | Status: DC

## 2016-08-02 MED ORDER — SODIUM CHLORIDE 0.9 % IV BOLUS (SEPSIS)
1000.0000 mL | Freq: Once | INTRAVENOUS | Status: AC
  Administered 2016-08-02: 1000 mL via INTRAVENOUS

## 2016-08-02 MED ORDER — SODIUM CHLORIDE 0.9 % IV BOLUS (SEPSIS)
500.0000 mL | Freq: Once | INTRAVENOUS | Status: AC
  Administered 2016-08-02: 500 mL via INTRAVENOUS

## 2016-08-02 MED ORDER — TRAMADOL HCL 50 MG PO TABS: 50.0000 mg | ORAL_TABLET | Freq: Four times a day (QID) | ORAL | 0 refills | Status: DC | PRN

## 2016-08-02 MED ORDER — GI COCKTAIL ~~LOC~~
30.0000 mL | Freq: Once | ORAL | Status: AC
  Administered 2016-08-02: 30 mL via ORAL
  Filled 2016-08-02: qty 30

## 2016-08-02 MED ORDER — ONDANSETRON HCL 4 MG/2ML IJ SOLN
4.0000 mg | Freq: Once | INTRAMUSCULAR | Status: AC
  Administered 2016-08-02: 4 mg via INTRAVENOUS
  Filled 2016-08-02: qty 2

## 2016-08-02 NOTE — ED Notes (Signed)
Knapp MD at bedside  

## 2016-08-02 NOTE — ED Notes (Signed)
Per Billy Fischer, MD orthostatic vitals

## 2016-08-02 NOTE — ED Provider Notes (Signed)
[place] DEPT Provider Note   CSN: [id number] Arrival date & time: 08/02/16  0413  Time seen 04:30 AM   History   Chief Complaint Chief Complaint  Patient presents with  . Near Syncope    HPI Misty Gamble is a 40 y.o. female.  HPI  Misty Gamble 10 P6 AB 4, who presents to the emergency department via EMS with her husband. She just left [facility] 2 AM after having a complete miscarriage after being diagnosed with a nonviable pregnancy at 6 weeks 4 days. She reports she was given tramadol and paperwork shows she got it at 2:03 AM. She states she felt fine when she left the hospital. She states she started crying in the car and started feeling dizzy. When they got her home husband states she was in her bedroom and she passed out. He thinks she was unconscious for about 15 minutes. She reports when she woke up the EMS personnel were already there. She states she's having some upper epigastric/lower central chest discomfort that she describes as heartburn and states it feels just like the pain she had before she had her gallbladder removed when she had gallstones. She denies getting any burning fluid in her throat. She states that she started having the pain before she passed out and it got worse just before she passed out and she got sweaty and started seeing bright lights. Husband also reports she is very sensitive to medications.  Past Medical History:  Diagnosis Date  . Aortic heart murmur on examination 08/19/2012   No previous Echo   . Elevated liver enzymes 08/19/2012  . Gestational diabetes   . Gestational diabetes mellitus 06/02/2015   Versus pre-gestation as early 1-hr 150; 3-hour 97/109/162/130 (per phone contact with HD, actual 3-hour results to be sent)      Patient Active Problem List   Diagnosis Date Noted  . Aortic heart murmur on examination 08/19/2012    Past Surgical History:  Procedure Laterality Date  . CHOLECYSTECTOMY      OB History    Gravida Para Term Preterm AB Living   10 6 6  0 3 6   SAB TAB Ectopic Multiple Live Births   3     0 6       Home Medications    Prior to Admission medications   Medication Sig Start Date End Date Taking? Authorizing Provider  naproxen (NAPROSYN) 500 MG tablet Take 1 tablet (500 mg total) by mouth 2 (two) times daily. 07/24/16  Yes Misty Breach, PA-C  Prenatal Vit-Fe Fumarate-FA (PRENATAL MULTIVITAMIN) TABS tablet Take 1 tablet by mouth daily at 12 noon.   Yes Historical Provider, MD  traMADol (ULTRAM) 50 MG tablet Take 1-2 tablets (50-100 mg total) by mouth every 6 (six) hours as needed for severe pain. Patient not taking: Reported on 08/02/2016 08/02/16   Manya Silvas, CNM    Family History History reviewed. No pertinent family history.  Social History Social History  Substance Use Topics  . Smoking status: Never Smoker  . Smokeless tobacco: Never Used  . Alcohol use No  Lives with spouse   Allergies   Ciprofloxacin   Review of Systems Review of Systems  All other systems reviewed and are negative.    Physical Exam Updated Vital Signs BP 128/79   Pulse 93   Resp 16   SpO2 100%   Vital signs normal    Physical Exam  Constitutional: She is oriented to person, place, and time. She appears  well-developed and well-nourished.  Non-toxic appearance. She does not appear ill. No distress.  HENT:  Head: Normocephalic and atraumatic.  Right Ear: External ear normal.  Left Ear: External ear normal.  Nose: Nose normal. No mucosal edema or rhinorrhea.  Mouth/Throat: Oropharynx is clear and moist and mucous membranes are normal. No dental abscesses or uvula swelling.  Eyes: Conjunctivae and EOM are normal. Pupils are equal, round, and reactive to light.  Neck: Normal range of motion and full passive range of motion without pain. Neck supple.  Cardiovascular: Normal rate, regular rhythm and normal heart sounds.  Exam reveals no gallop and no friction rub.   No murmur  heard. Pulmonary/Chest: Effort normal and breath sounds normal. No respiratory distress. She has no wheezes. She has no rhonchi. She has no rales. She exhibits no tenderness and no crepitus.  Abdominal: Soft. Normal appearance and bowel sounds are normal. She exhibits no distension. There is no tenderness. There is no rebound and no guarding.    Area of pain noted, mildly tender  Musculoskeletal: Normal range of motion. She exhibits no edema or tenderness.  Moves all extremities well.   Neurological: She is alert and oriented to person, place, and time. She has normal strength. No cranial nerve deficit.  Skin: Skin is warm, dry and intact. No rash noted. No erythema. No pallor.  Psychiatric: She has a normal mood and affect. Her speech is normal and behavior is normal. Her mood appears not anxious.  Nursing note and vitals reviewed.    ED Treatments / Results  Labs (all labs ordered are listed, but only abnormal results are displayed) Results for orders placed or performed during the hospital encounter of [id number]  Basic metabolic panel  Result Value Ref Range   Sodium 137 135 - 145 mmol/L   Potassium 3.4 (L) 3.5 - 5.1 mmol/L   Chloride 106 101 - 111 mmol/L   CO2 22 22 - 32 mmol/L   Glucose, Bld 138 (H) 65 - 99 mg/dL   BUN 10 6 - 20 mg/dL   Creatinine, Ser 0.62 0.44 - 1.00 mg/dL   Calcium 8.9 8.9 - 10.3 mg/dL   GFR calc non Af Amer >60 >60 mL/min   GFR calc Af Amer >60 >60 mL/min   Anion gap 9 5 - 15  CBC  Result Value Ref Range   WBC 6.5 4.0 - 10.5 K/uL   RBC 3.95 3.87 - 5.11 MIL/uL   Hemoglobin 12.0 12.0 - 15.0 Gamble/dL   HCT 34.7 (L) 36.0 - 46.0 %   MCV 87.8 78.0 - 100.0 fL   MCH 30.4 26.0 - 34.0 pg   MCHC 34.6 30.0 - 36.0 Gamble/dL   RDW 12.5 11.5 - 15.5 %   Platelets 162 150 - 400 K/uL  Troponin I  Result Value Ref Range   Troponin I <0.03 <0.03 ng/mL  Lipase, blood  Result Value Ref Range   Lipase 41 11 - 51 U/L  Hepatic function panel  Result Value Ref Range   Total  Protein 6.2 (L) 6.5 - 8.1 Gamble/dL   Albumin 3.7 3.5 - 5.0 Gamble/dL   AST 91 (H) 15 - 41 U/L   ALT 56 (H) 14 - 54 U/L   Alkaline Phosphatase 62 38 - 126 U/L   Total Bilirubin 0.8 0.3 - 1.2 mg/dL   Bilirubin, Direct 0.2 0.1 - 0.5 mg/dL   Indirect Bilirubin 0.6 0.3 - 0.9 mg/dL   Laboratory interpretation all normal except mild hypokalemia, elevated LFT's (states  she was told she had 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   Radiology No results found.   US Ob Comp Less 14 Wks  US Ob Transvaginal  Result Date: 07/24/2016 CLINICAL DATA:  Vaginal bleeding since 1 a.m. Estimated gestational age by LMP is 19 weeks 6 days. Quantitative beta HCG is 9,939. No comparison available.  IMPRESSION: Single intrauterine pregnancy. Estimated gestational age by crown-rump length is 6 weeks 4 days, demonstrating no growth since the previous study and discordant with maternal clinical dates. No fetal cardiac activity is observed. Findings meet definitive criteria for failed pregnancy. This follows SRU consensus guidelines: Diagnostic Criteria for Nonviable Pregnancy Early in the First Trimester. Alison Stalling J Med [phone]. Electronically Signed   By: Lucienne Capers M.D.   On: 07/24/2016 06:06   US Ob Transvaginal  US Ob Comp Less 14 Wks  Result Date: 07/15/2016 CLINICAL DATA:  Acute onset of pelvic pain.  Initial encounter.Marland Kitchen IMPRESSION: Single intrauterine pregnancy noted, with a crown-rump length of 8 mm, corresponding to a gestational age of [redacted] weeks 5 days. However, no cardiac activity is  visualized. Findings meet definitive criteria for failed pregnancy. This follows SRU consensus guidelines: Diagnostic Criteria for Nonviable Pregnancy Early in the First Trimester. Alison Stalling J Med [phone]. Electronically Signed   By: Garald Balding M.D.   On: 07/15/2016 21:00    Procedures Procedures (including critical care time)  Medications Ordered in ED Medications  sodium chloride 0.9 % bolus 1,000 mL (0 mLs Intravenous Stopped 08/02/16 0646)  ondansetron (ZOFRAN) injection 4 mg (4 mg Intravenous Given 08/02/16 0532)  famotidine (PEPCID) IVPB 20 mg premix (0 mg Intravenous Stopped 08/02/16 0602)  gi cocktail (Maalox,Lidocaine,Donnatal) (30 mLs Oral Given 08/02/16 0532)  sodium chloride 0.9 % bolus 1,000 mL (0 mLs Intravenous Stopped 08/02/16 0810)  sodium chloride 0.9 % bolus 500 mL (0 mLs Intravenous Stopped 08/02/16 0810)     Initial Impression / Assessment and Plan / ED Course  I have reviewed the triage vital signs and the nursing notes.  Pertinent labs & imaging results that were available during my care of the patient were reviewed by me and considered in my medical decision making (see chart for details).  Clinical Course    Patient was given IV fluids and nausea medications. She was given IV Pepcid and a GI cocktail.  Orthostatic VS for the past 24 hrs:  BP- Lying Pulse- Lying BP- Sitting Pulse- Sitting BP- Standing at 0 minutes  08/02/16 0555 119/89 83 128/88 88 -    PT unable to stand to complete her orthostatics. She was given more fluid.   6:24 AM . She states she's feeling better. She denies having any lower abdominal pain and states her upper abdominal pain is gone. However I'm concerned about her orthostatics where she was unable to stand despite having a liter and a half of fluid. Going to repeat her ultrasound just to make sure she doesn't have a lot of free fluid that would be suggestive of a heterotrophic pregnancy.  08:00 AM patient turned  over to Dr  Billy Fischer at change of shift to get results of her pelvic ultrasound.   Final Clinical Impressions(s) / ED Diagnoses   Final diagnoses:  Syncope  Syncope, unspecified syncope type  Orthostatic hypotension  Complete miscarriage  Gastroesophageal reflux disease without esophagitis  Epigastric abdominal pain    Disposition pending  Rolland Porter, MD, Barbette Or, MD 08/02/16 [phone]

## 2016-08-02 NOTE — ED Notes (Signed)
Called lab to add on hcg

## 2016-08-02 NOTE — ED Notes (Signed)
Patient used bedpan to provide an urine sample

## 2016-08-02 NOTE — ED Notes (Addendum)
Pt family at bedside

## 2016-08-02 NOTE — MAU Provider Note (Signed)
Chief Complaint: Vaginal Bleeding   First Provider Initiated Contact with Patient 08/02/16 0244     SUBJECTIVE HPI: Misty Gamble is a 40 y.o. [id number] female previously diagnosed with missed AB measuring 6 weeks 5 days who presents to Maternity Admissions reporting increased bleeding, cramping and passage of a bag of water tonight.   She was first seen in maternity admissions November left 2017 and diagnosed with missed AB measuring 6 weeks 5 days. Was not accepting of the diagnosis. Was seen at [facility] long emergency department on 07/24/2016 for vaginal bleeding and repeat ultrasound confirmed missed AB. Patient was given Cytotec to complete miscarriage.   Location: Low abdomen Quality: Contractions Severity: 10/10 on pain scale immediately after passing tissue, now 7/10 Duration: 2 hours Context: Miscarriage Timing: Intermittent Modifying factors: Minimal improvement with ibuprofen Associated signs and symptoms: Positive for moderate vaginal bleeding. Negative for fever, chills, urinary complaints or GI complaints.  Past Medical History:  Diagnosis Date  . Aortic heart murmur on examination 08/19/2012   No previous Echo   . Elevated liver enzymes 08/19/2012  . Gestational diabetes   . Gestational diabetes mellitus 06/02/2015   Versus pre-gestation as early 1-hr 150; 3-hour 97/109/162/130 (per phone contact with HD, actual 3-hour results to be sent)     OB History  Gravida Para Term Preterm AB Living  10 6 6  0 3 6  SAB TAB Ectopic Multiple Live Births  3     0 6    # Outcome Date GA Lbr Len/2nd Weight Sex Delivery Anes PTL Lv  10 Current           9 Term 08/11/15 [redacted]w[redacted]d 34:35 / 00:07 7 lb 4.1 oz (3.29 kg) M Vag-Spont EPI  LIV  8 Term 06/28/07 [redacted]w[redacted]d   M Vag-Spont   LIV     Birth Comments: no complications, born In [place]  7 Term 02/16/05 [redacted]w[redacted]d   Jerilynn Mages Vag-Spont   LIV     Birth Comments: born [facility] in [place], [place], GDM  6 SAB 2004          5 SAB 2003           Birth Comments: twins  4 Term 02/15/98 [redacted]w[redacted]d   F Vag-Spont   LIV     Birth Comments: born [place], no complications  3 Term [birth year] [redacted]w[redacted]d   M Vag-Spont   LIV     Birth Comments: no complications, born [facility]  2 Term 12/26[birth year] [redacted]w[redacted]d   M Vag-Spont   LIV     Birth Comments: no complications, born in [place]  1 [place] [redacted]w[redacted]d            Birth Comments: twins, both died.      Past Surgical History:  Procedure Laterality Date  . CHOLECYSTECTOMY     Social History   Social History  . Marital status: Married    Spouse name: N/A  . Number of children: N/A  . Years of education: N/A   Occupational History  . Not on file.   Social History Main Topics  . Smoking status: Never Smoker  . Smokeless tobacco: Never Used  . Alcohol use No  . Drug use: No  . Sexual activity: No   Other Topics Concern  . Not on file   Social History Narrative  . No narrative on file   No family history on file. No current facility-administered medications on file prior to encounter.    Current Outpatient Prescriptions on File  Prior to Encounter  Medication Sig Dispense Refill  . misoprostol (CYTOTEC) 200 MCG tablet Place 5 tablets (1,000 mcg total) vaginally daily. 10 tablet 0  . naproxen (NAPROSYN) 500 MG tablet Take 1 tablet (500 mg total) by mouth 2 (two) times daily. 30 tablet 0  . Prenatal Vit-Fe Fumarate-FA (PRENATAL MULTIVITAMIN) TABS tablet Take 1 tablet by mouth daily at 12 noon.     Allergies  Allergen Reactions  . Ciprofloxacin Rash    I have reviewed patient's Past Medical Hx, Surgical Hx, Family Hx, Social Hx, medications and allergies.   Review of Systems  Constitutional: Negative for chills and fever.  Gastrointestinal: Positive for abdominal pain. Negative for constipation, diarrhea, nausea and vomiting.  Genitourinary: Positive for vaginal bleeding. Negative for dysuria.  Musculoskeletal: Negative for back pain.  Neurological: Negative for dizziness.    OBJECTIVE Patient  Vitals for the past 24 hrs:  BP Temp Pulse Height Weight  08/02/16 0107 122/76 97.8 F (36.6 C) 70 5\' 1"  (1.549 m) 147 lb 8 oz (66.9 kg)   Constitutional: Well-developed, well-nourished female in no acute distress.  Skin: No pallor Cardiovascular: normal rate Respiratory: normal rate and effort.  GI: Abd soft, non-tender. MS: Extremities nontender, no edema, normal ROM Neurologic: Alert and oriented x 4.  GU:  PELVIC EXAM: NEFG, small amount of bright red blood.  LAB RESULTS Results for orders placed or performed during the hospital encounter of 08/02/16 (from the past 24 hour(s))  CBC     Status: Abnormal   Collection Time: 08/02/16  2:06 AM  Result Value Ref Range   WBC 8.3 4.0 - 10.5 K/uL   RBC 4.16 3.87 - 5.11 MIL/uL   Hemoglobin 12.9 12.0 - 15.0 g/dL   HCT 35.9 (L) 36.0 - 46.0 %   MCV 86.3 78.0 - 100.0 fL   MCH 31.0 26.0 - 34.0 pg   MCHC 35.9 30.0 - 36.0 g/dL   RDW 12.8 11.5 - 15.5 %   Platelets 193 150 - 400 K/uL    IMAGING N/A  MAU COURSE Orders Placed This Encounter  Procedures  . CBC   Meds ordered this encounter  Medications  . traMADol (ULTRAM) tablet 100 mg   Tissue examined--intact gestational sac containing a fetus. Pain improved significantly after tramadol. Minimal bleeding while in MAU.  MDM - Completed miscarriage without hemorrhage or evidence of infection.  ASSESSMENT 1. Complete miscarriage     PLAN Discharge home in stable condition. SAB precautions Support given. Rx tramadol. Follow-up [facility] for [place] Follow up.   Specialty:  Obstetrics and Gynecology Why:  Will call you to schedule a follow-up appointment in 2 weeks Contact information: [address] [place]       [place] Follow up.   Why:  As needed in emergencies Contact information: [id number] [place]  [place] [phone]           Medication List    STOP taking these medications   misoprostol 200 MCG tablet Commonly known as:  CYTOTEC     TAKE these medications   naproxen 500 MG tablet Commonly known as:  NAPROSYN Take 1 tablet (500 mg total) by mouth 2 (two) times daily.   prenatal multivitamin Tabs tablet Take 1 tablet by mouth daily at 12 noon.   traMADol 50 MG tablet Commonly known as:  ULTRAM Take 1-2 tablets (50-100 mg total) by mouth  every 6 (six) hours as needed for severe pain.        [place], [place] 08/02/2016  2:49 AM

## 2016-08-02 NOTE — ED Provider Notes (Signed)
  Physical Exam  BP 109/76   Pulse 63   Resp 12   SpO2 97%   Physical Exam  ED Course  Procedures  MDM Please see Dr. Johnsie Kindred note for prior care, received care of pt at [facility]. Briefly this is a 40yo female with recent missed AB who was seen in MAU last night for abdominal cramping, passing of fetus, and complete miscarriage. Received tramadol and felt lightheaded prior to discharge and came to ED with syncopal episode, and epigastric pain.  [place] pending.   Pt without significant bleeding, hgb normal.  US shows complete AB, no sign of ectopic. Patient hemodynamically stable in the ED.  Suspect side effect of tramadol. Repeat orthostatics WNL.  Abd exam benign, pt without GB, doubt other acute intraabdominal process. Doubt PE/ACS given no CP/SOB. Will give rx for PPI BID for gastritis. Patient discharged in stable condition with understanding of reasons to return.      Gareth Morgan, MD 08/02/16 1733

## 2016-08-02 NOTE — MAU Note (Addendum)
PT  SAYS SHE HAS ABD  PAIN   ALL  DAY  BUT  WORSE AT 10PM.      HAS BEEN  BLEEDING  ALL DAY -   BLEEDING  HEAVIER   SINCE    PASSED  TISSUE  AT 0030.      TOOK  IBUPROFEN   600MG    AT   10PM

## 2016-08-02 NOTE — ED Notes (Signed)
Pt in MRI.

## 2016-08-02 NOTE — ED Triage Notes (Signed)
Patient comes via EMS for a near syncopal episode.  Patient had miscarriage yesterday and got dizzy and lightheaded and laid down on the floor.  Husband called 61.  No bleeding noted, patient is A&Ox4.  100cc fluids started by EMS.

## 2016-08-02 NOTE — ED Notes (Signed)
Patient d/c'd from IV, monitor, continuous pulse oximetry and blood pressure cuff; patient getting dressed to be discharged home 

## 2016-08-02 NOTE — ED Notes (Signed)
Patient placed back on monitor, continuous pulse oximetry and blood pressure cuff; visitor at bedside

## 2016-08-02 NOTE — Discharge Instructions (Signed)
Aborto espontáneo °(Miscarriage) °El aborto espontáneo es la pérdida de un bebé que no ha nacido (feto) antes de la semana 20 del embarazo. La mayor parte de estos abortos ocurre en los primeros 3 meses. En algunos casos ocurre antes de que la mujer sepa que está embarazada. También se denomina "aborto espontáneo" o "pérdida prematura del embarazo". El aborto espontáneo puede ser una experiencia que afecte emocionalmente a la persona. Converse con su médico si tiene dudas, cómo es el proceso de duelo, y sobre planes futuros de embarazo. °CAUSAS °· Algunos problemas cromosómicos pueden hacer imposible que el bebé se desarrolle normalmente. Los problemas con los genes o cromosomas del bebé son generalmente el resultado de errores que se producen, por casualidad, cuando el embrión se divide y crece. Estos problemas no se heredan de los padres. °· Infección en el cuello del útero. °· Problemas hormonales. °· Problemas en el cuello del útero, como tener un útero incompetente. Esto ocurre cuando los tejidos no son lo suficientemente fuertes como para contener el embarazo. °· Problemas del útero, como un útero con forma anormal, los fibromas o anormalidades congénitas. °· Ciertas enfermedades crónicas. °· No fume, no beba alcohol, ni consuma drogas. °· Traumatismos °A veces, la causa es desconocida. °SÍNTOMAS °· Sangrado o manchado vaginal, con o sin cólicos o dolor. °· Dolor o cólicos en el abdomen o en la cintura. °· Eliminación de líquido, tejidos o coágulos grandes por la vagina. ° °DIAGNÓSTICO °El médico le hará un examen físico. También le indicará una ecografía para confirmar el aborto. Es posible que se realicen análisis de sangre. °TRATAMIENTO °· En algunos casos el tratamiento no es necesario, si se eliminan naturalmente todos los tejidos embrionarios que se encontraban en el útero. Si el feto o la placenta quedan dentro del útero (aborto incompleto), pueden infectarse, los tejidos que quedan pueden infectarse y  deben retirarse. Generalmente se realiza un procedimiento de dilatación y curetaje (D y C). Durante el procedimiento de dilatación y curetaje, el cuello del útero se abre (dilata) y se retira cualquier resto de tejido fetal o placentario del útero. °· Si hay una infección, le recetarán antibióticos. Podrán recetarle otros medicamentos para reducir el tamaño del útero (contraerlo) si hay una mucho sangrado. °· Si su sangre es Rh negativa y su bebé es Rh positivo, usted necesitará la inyección de inmunoglobulina Rh. Esta inyección protegerá a los futuros bebés de tener problemas de compatibilidad Rh en futuros embarazos. ° °INSTRUCCIONES PARA EL CUIDADO EN EL HOGAR °· El médico le indicará reposo en cama o le permitirá realizar actividades livianas. Vuelva a la actividad lentamente o según las indicaciones de su médico. °· [place] a alguien que la ayude con las responsabilidades familiares y del hogar durante este tiempo. °· Lleve un registro de la cantidad y la saturación de las toallas higiénicas que utiliza cada día. Anote esta información °· No use tampones. No No se haga duchas vaginales ni tenga relaciones sexuales hasta que el médico la autorice. °· Sólo tome medicamentos de venta libre o recetados para calmar el dolor o el malestar, según las indicaciones de su médico. °· No tome aspirina. La aspirina puede ocasionar hemorragias. °· Concurra puntualmente a las citas de control con el médico. °· Si usted o su pareja tienen dificultades con el duelo, hable con su médico para buscar la ayuda psicológica que los ayude a enfrentar la pérdida del embarazo. Permítase el tiempo suficiente de duelo antes de quedar embarazada nuevamente. ° °SOLICITE ATENCIÓN MÉDICA DE INMEDIATO SI: °·   Siente calambres intensos o dolor en la espalda o en el abdomen. °· Tiene fiebre. °· Elimina grandes coágulos de sangre (del tamaño de una nuez o más) o tejidos por la vagina. Guarde lo que ha eliminado para que su médico lo examine. °· La  hemorragia aumenta. °· Observa una secreción vaginal espesa y con mal olor. °· Se siente mareada, débil, o se desmaya. °· Siente escalofríos. ° °ASEGÚRESE DE QUE: °· Comprende estas instrucciones. °· Controlará su enfermedad. °· Solicitará ayuda de inmediato si no mejora o si empeora. ° °Esta información no tiene como fin reemplazar el consejo del médico. Asegúrese de hacerle al médico cualquier pregunta que tenga. °Document Released: 05/31/2005 Document Revised: 12/16/2012 Document Reviewed: 10/10/2011 °Elsevier Interactive Patient Education © 2017 Elsevier Inc. ° °

## 2016-08-24 ENCOUNTER — Encounter: Payer: Self-pay | Admitting: Medical

## 2016-12-08 IMAGING — US US ABDOMEN COMPLETE
1 series · 14 of 25 positions shown · non-contrast
Comparison: OB exam 03/26/2015

CLINICAL DATA: Left-sided pain. Palpable area 1 year. Twenty-one
weeks pregnant.

EXAM:
ULTRASOUND ABDOMEN COMPLETE

[Series 1: us abdomen complete · 14 of 59 slices shown]
[im 1/59]
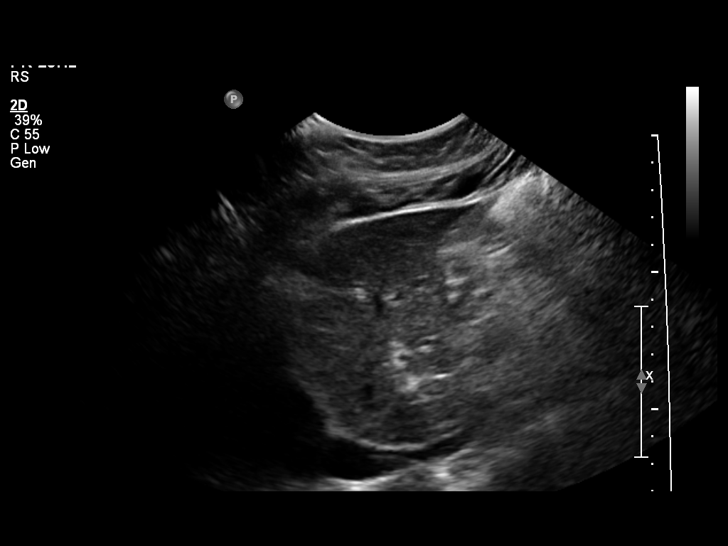
[im 5/59]
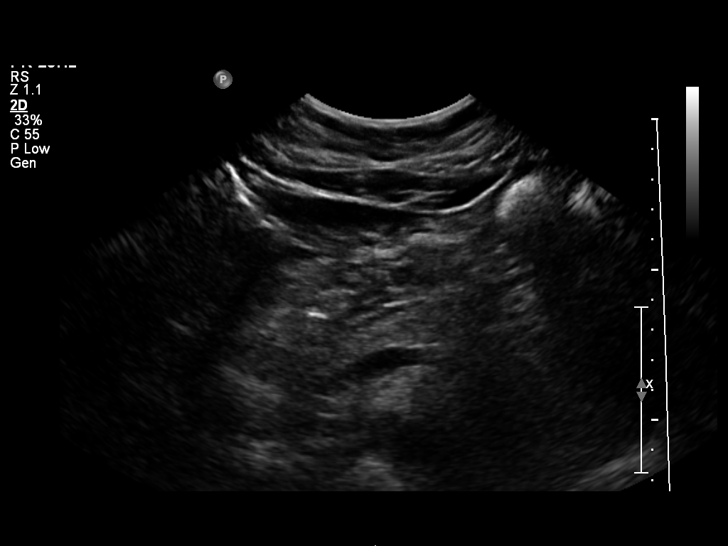
[im 10/59]
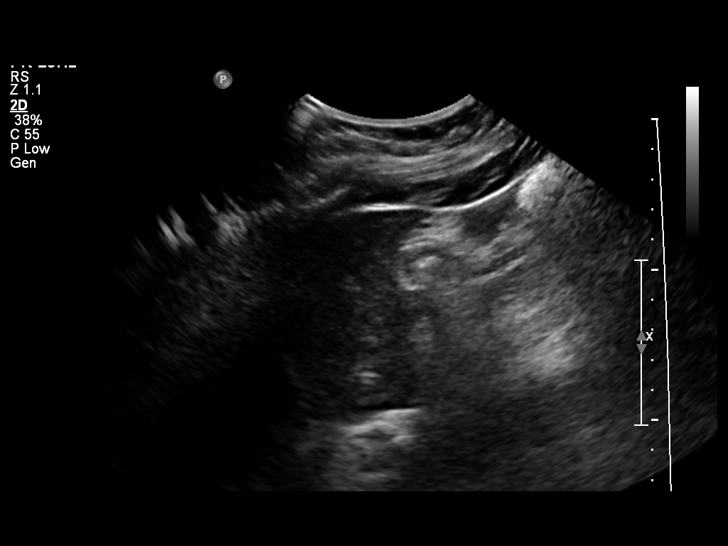
[im 15/59]
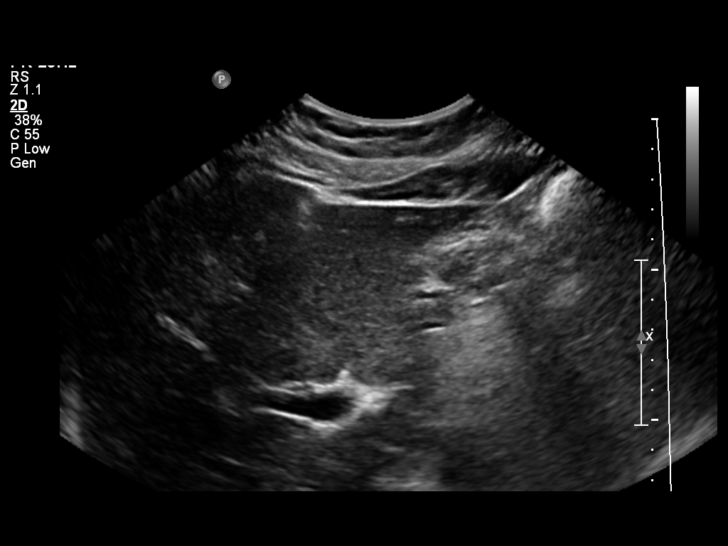
[im 20/59]
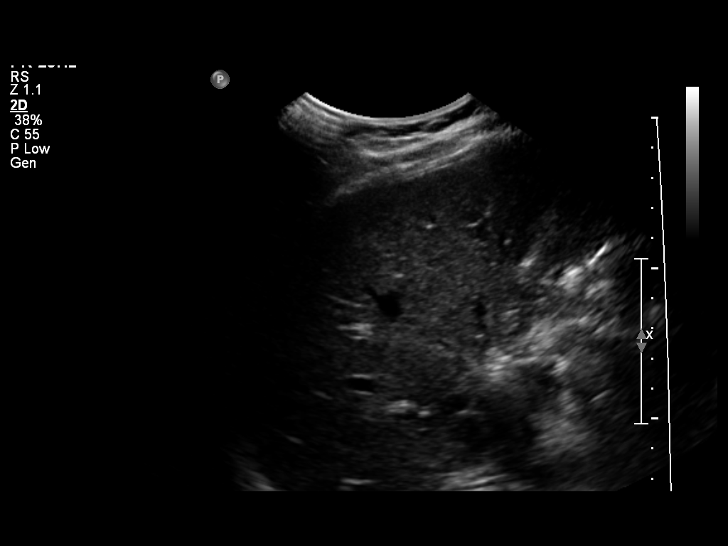
[im 22/59]
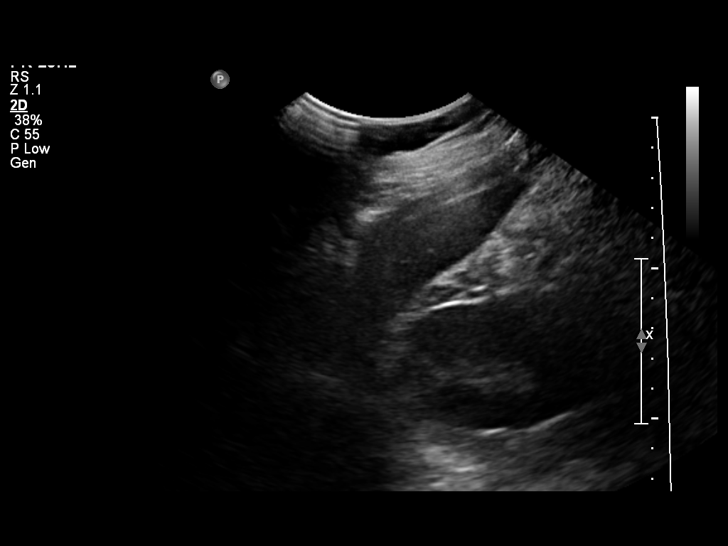
[im 27/59]
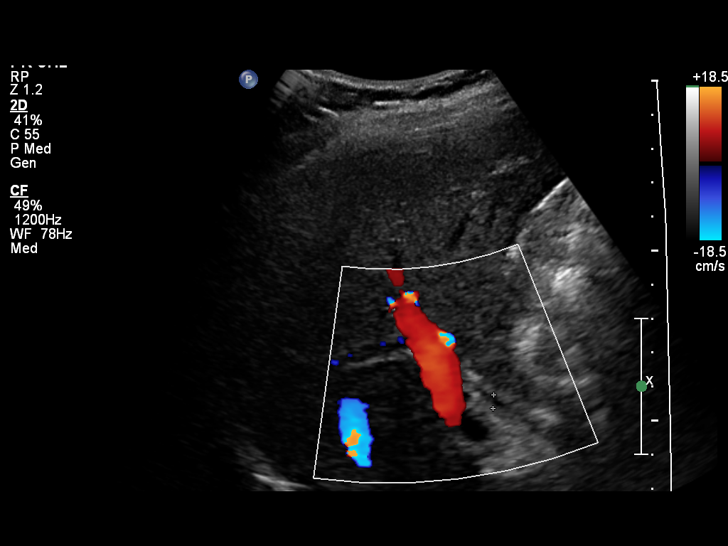
[im 32/59]
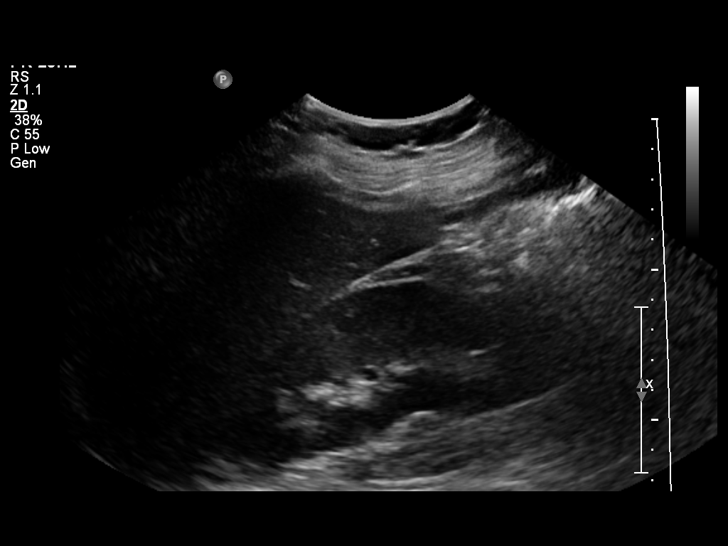
[im 37/59]
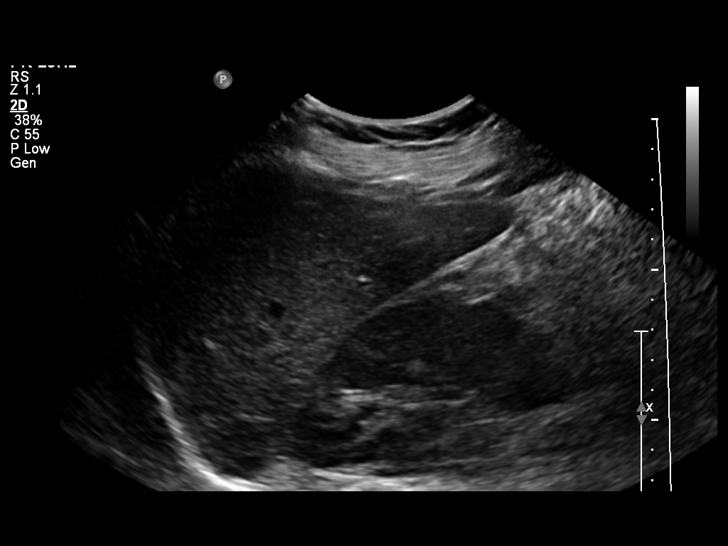
[im 39/59]
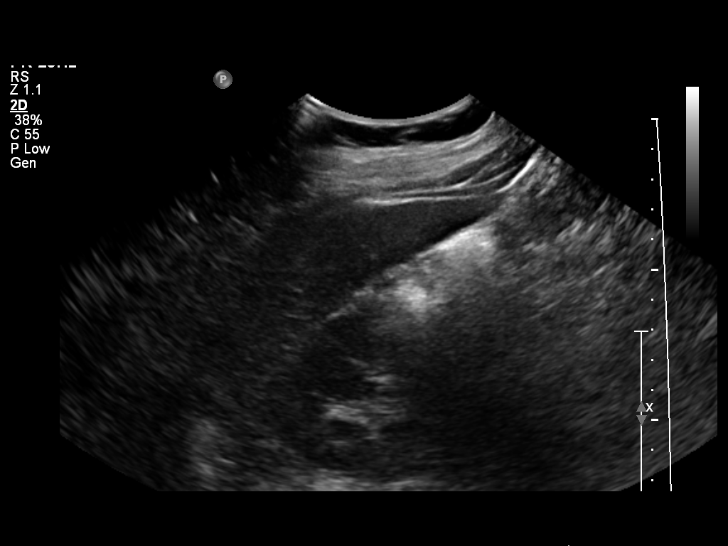
[im 44/59]
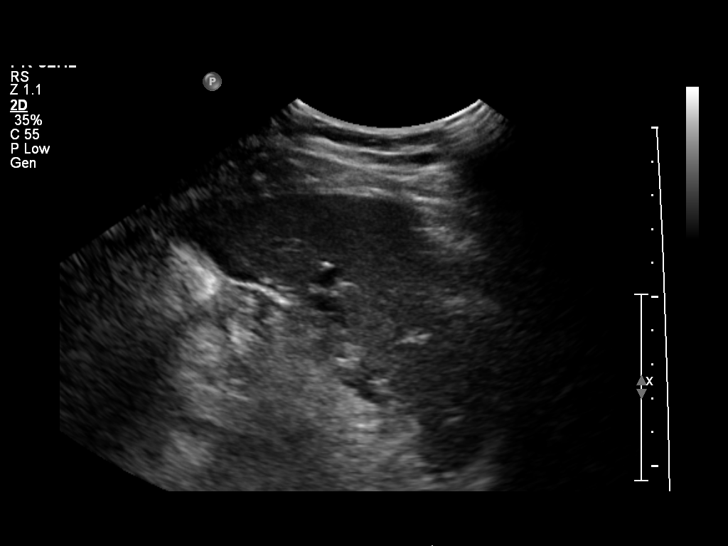
[im 49/59]
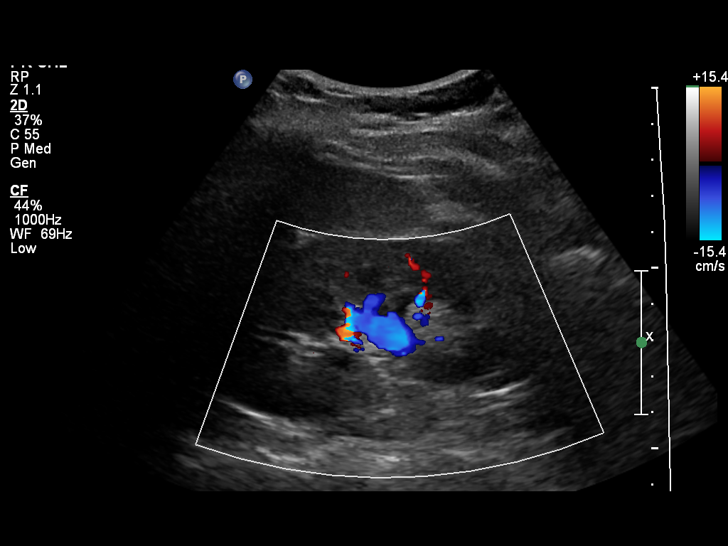
[im 54/59]
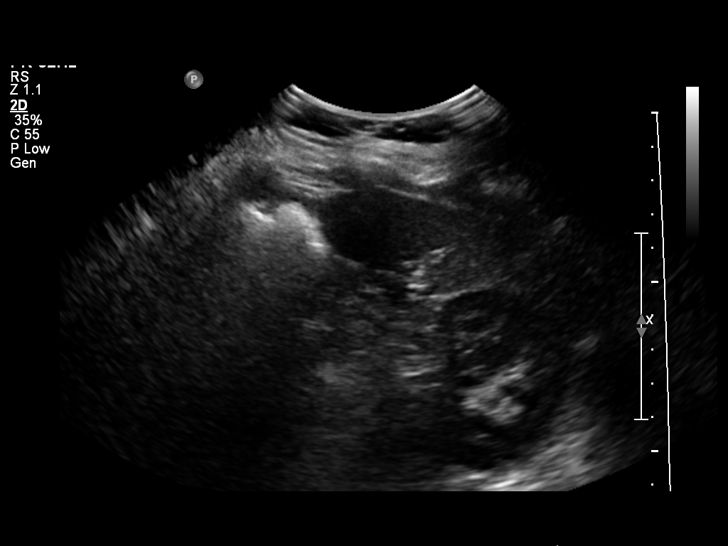
[im 59/59]
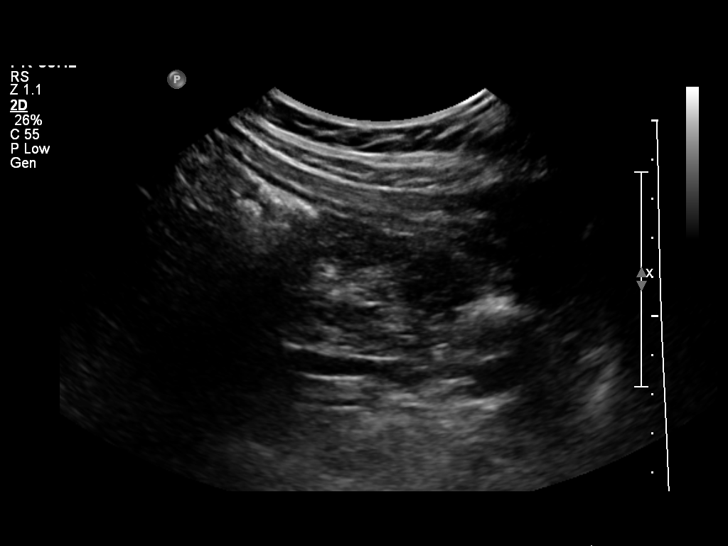

[14 of 25 positions shown; findings below may reference images not displayed]

FINDINGS: Gallbladder: Previous cholecystectomy.

Common bile duct: Diameter: 4.0 mm

Liver: No focal lesion identified. Within normal limits in
parenchymal echogenicity.

IVC: No abnormality visualized.

Pancreas: Visualized portion unremarkable.

Spleen: Size and appearance within normal limits.

Right Kidney: Length: 12.0 cm. Echogenicity within normal limits. No
mass or hydronephrosis visualized.

Left Kidney: Length: 11.0 cm. Echogenicity within normal limits. No
mass or hydronephrosis visualized.

Abdominal aorta: 2.2 cm

Other findings: Ultrasound is performed in the area of patient's
concern. No focal abnormality identified in this region.
IMPRESSION: 1.  No evidence for acute  abnormality.
2. Targeted evaluation of the area of patient's concern is negative.

## 2017-03-02 ENCOUNTER — Emergency Department (HOSPITAL_COMMUNITY)
Admission: EM | Admit: 2017-03-02 | Discharge: 2017-03-02 | Disposition: A | Discharge status: Home / Self Care | Payer: Self-pay | Attending: Emergency Medicine | Admitting: Emergency Medicine

## 2017-03-02 ENCOUNTER — Encounter (HOSPITAL_COMMUNITY): Payer: Self-pay | Admitting: *Deleted

## 2017-03-02 DIAGNOSIS — R55 Syncope and collapse: Secondary | ICD-10-CM | POA: Insufficient documentation

## 2017-03-02 LAB — I-STAT CHEM 8, ED
BUN: 12 mg/dL (ref 6–20)
CALCIUM ION: 1.14 mmol/L — AB (ref 1.15–1.40)
CHLORIDE: 107 mmol/L (ref 101–111)
Creatinine, Ser: 0.6 mg/dL (ref 0.44–1.00)
Glucose, Bld: 103 mg/dL — ABNORMAL HIGH (ref 65–99)
HCT: 36 % (ref 36.0–46.0)
Hemoglobin: 12.2 g/dL (ref 12.0–15.0)
Potassium: 3.5 mmol/L (ref 3.5–5.1)
SODIUM: 142 mmol/L (ref 135–145)
TCO2: 25 mmol/L (ref 0–100)

## 2017-03-02 LAB — PREGNANCY, URINE: PREG TEST UR: NEGATIVE

## 2017-03-02 MED ORDER — SODIUM CHLORIDE 0.9 % IV BOLUS (SEPSIS)
1000.0000 mL | Freq: Once | INTRAVENOUS | Status: AC
  Administered 2017-03-02: 1000 mL via INTRAVENOUS

## 2017-03-02 NOTE — ED Triage Notes (Signed)
Pt was with her son and dad yelled for help, pt was found leaning on bed, not responding, pt assisted to bed with another RN and opened her eyes but appeared disoriented, pt not verbally responding. Pt husband reports history of chest pain and feeling sweaty, today pt felt dizzy over past 5 days. Also states pt with left side face tingling or tight feeling.

## 2017-03-02 NOTE — ED Notes (Signed)
Nurse drawing  Labs.

## 2017-03-02 NOTE — ED Provider Notes (Signed)
[facility] DEPT Provider Note   CSN: [id number] Arrival date & time: 03/02/17  1953     History   Chief Complaint Chief Complaint  Patient presents with  . Loss of Consciousness    HPI Misty Gamble is a 41 y.o. female.  41 yo F with a chief complaint of a syncopal event. Patient was in the pediatric ED with her son started feeling lightheaded and laid her head on the bed and then woke up with people standing around her. There is no fall or injury. She denies any chest pain shortness breath or headache during the event. Over the course of the past 5 days however she has had some episodic left sided chest pain that is sharp and worse with movement palpation. She denies any trauma. Has been having some paresthesias to her face off and on for the past couple months.   The history is provided by the patient.  Loss of Consciousness   This is a new problem. The current episode started less than 1 hour ago. The problem occurs rarely. The problem has been resolved. She lost consciousness for a period of less than one minute. The problem is associated with sitting up. Associated symptoms include light-headedness. Pertinent negatives include chest pain, congestion, dizziness, fever, headaches, nausea, palpitations and vomiting. She has tried nothing for the symptoms. The treatment provided no relief.    Past Medical History:  Diagnosis Date  . Aortic heart murmur on examination 08/19/2012   No previous Echo   . Elevated liver enzymes 08/19/2012  . Gestational diabetes   . Gestational diabetes mellitus 06/02/2015   Versus pre-gestation as early 1-hr 150; 3-hour 97/109/162/130 (per phone contact with HD, actual 3-hour results to be sent)      Patient Active Problem List   Diagnosis Date Noted  . Aortic heart murmur on examination 08/19/2012    Past Surgical History:  Procedure Laterality Date  . CHOLECYSTECTOMY      OB History    Gravida Para Term Preterm AB Living   10 6  6  0 3 6   SAB TAB Ectopic Multiple Live Births   3     0 6       Home Medications    Prior to Admission medications   Medication Sig Start Date End Date Taking? Authorizing Provider  naproxen (NAPROSYN) 500 MG tablet Take 1 tablet (500 mg total) by mouth 2 (two) times daily. 07/24/16   Antonietta Breach, PA-C  pantoprazole (PROTONIX) 20 MG tablet Take 1 tablet (20 mg total) by mouth 2 (two) times daily. 08/02/16   Gareth Morgan, MD  Prenatal Vit-Fe Fumarate-FA (PRENATAL MULTIVITAMIN) TABS tablet Take 1 tablet by mouth daily at 12 noon.    [provider]  traMADol (ULTRAM) 50 MG tablet Take 1-2 tablets (50-100 mg total) by mouth every 6 (six) hours as needed for severe pain. Patient not taking: Reported on 08/02/2016 08/02/16   Manya Silvas, CNM    Family History No family history on file.  Social History Social History  Substance Use Topics  . Smoking status: Never Smoker  . Smokeless tobacco: Never Used  . Alcohol use No     Allergies   Ciprofloxacin   Review of Systems Review of Systems  Constitutional: Negative for chills and fever.  HENT: Negative for congestion and rhinorrhea.   Eyes: Negative for redness and visual disturbance.  Respiratory: Negative for shortness of breath and wheezing.   Cardiovascular: Positive for syncope. Negative for chest pain  and palpitations.  Gastrointestinal: Negative for nausea and vomiting.  Genitourinary: Negative for dysuria and urgency.  Musculoskeletal: Negative for arthralgias and myalgias.  Skin: Negative for pallor and wound.  Neurological: Positive for syncope and light-headedness. Negative for dizziness and headaches.     Physical Exam Updated Vital Signs BP 130/90   Pulse 81   Temp 97.7 F (36.5 C) (Temporal)   Resp 19   Wt 68 kg (150 lb)   SpO2 100%   BMI 28.34 kg/m   Physical Exam  Constitutional: She is oriented to person, place, and time. She appears well-developed and well-nourished. No  distress.  HENT:  Head: Normocephalic and atraumatic.  Eyes: EOM are normal. Pupils are equal, round, and reactive to light.  Neck: Normal range of motion. Neck supple.  Cardiovascular: Normal rate and regular rhythm.  Exam reveals no gallop and no friction rub.   No murmur heard. Pulmonary/Chest: Effort normal. She has no wheezes. She has no rales.  Abdominal: Soft. She exhibits no distension and no mass. There is no tenderness. There is no guarding.  Musculoskeletal: She exhibits no edema or tenderness.  Neurological: She is alert and oriented to person, place, and time.  Skin: Skin is warm and dry. She is not diaphoretic.  Psychiatric: She has a normal mood and affect. Her behavior is normal.  Nursing note and vitals reviewed.    ED Treatments / Results  Labs (all labs ordered are listed, but only abnormal results are displayed) Labs Reviewed  I-STAT CHEM 8, ED - Abnormal; Notable for the following:       Result Value   Glucose, Bld 103 (*)    Calcium, Ion 1.14 (*)    All other components within normal limits  PREGNANCY, URINE  POC URINE PREG, ED    EKG  EKG Interpretation  Date/Time:  Friday March 02 2017 20:02:28 EDT Ventricular Rate:  78 PR Interval:    QRS Duration: 87 QT Interval:  391 QTC Calculation: 446 R Axis:   84 Text Interpretation:  Sinus rhythm no wpw, prolonged qt or brugada Otherwise no significant change Confirmed by Deno Etienne [phone]) on 03/02/2017 8:14:05 PM       Radiology No results found.  Procedures Procedures (including critical care time)  Medications Ordered in ED Medications  sodium chloride 0.9 % bolus 1,000 mL (0 mLs Intravenous Stopped 03/02/17 2250)     Initial Impression / Assessment and Plan / ED Course  I have reviewed the triage vital signs and the nursing notes.  Pertinent labs & imaging results that were available during my care of the patient were reviewed by me and considered in my medical decision making (see chart  for details).     41 yo F With a chief complaint of syncopal event. Back to baseline on arrival to the ED. No murmurs on my exam. EKG unremarkable. The patient thinks that she may be pregnant. Will obtain a pregnancy test as well as a istat chem 8.  11:27 PM:  I have discussed the diagnosis/risks/treatment options with the patient and family and believe the pt to be eligible for discharge home to follow-up with PCP. We also discussed returning to the ED immediately if new or worsening sx occur. We discussed the sx which are most concerning (e.g., sudden worsening pain, fever, inability to tolerate by mouth) that necessitate immediate return. Medications administered to the patient during their visit and any new prescriptions provided to the patient are listed below.  Medications given during  this visit Medications  sodium chloride 0.9 % bolus 1,000 mL (0 mLs Intravenous Stopped 03/02/17 2250)     The patient appears reasonably screen and/or stabilized for discharge and I doubt any other medical condition or other [facility] requiring further screening, evaluation, or treatment in the ED at this time prior to discharge.    Final Clinical Impressions(s) / ED Diagnoses   Final diagnoses:  Syncope and collapse    New Prescriptions New Prescriptions   No medications on file     Deno Etienne, DO 03/02/17 2327

## 2017-03-02 NOTE — ED Notes (Signed)
ED Provider at bedside. 

## 2017-03-02 NOTE — ED Notes (Signed)
Pt awake and alert at this time, states her face feels like it is asleep

## 2017-03-09 IMAGING — US US MFM FETAL BPP W/O NON-STRESS
1 series · 13 of 28 positions shown · non-contrast
Comparison: none

[Series 1: us mfm fetal bpp w/o non-stress · 53 acquisitions, 13 frames shown]
[im 2/53]
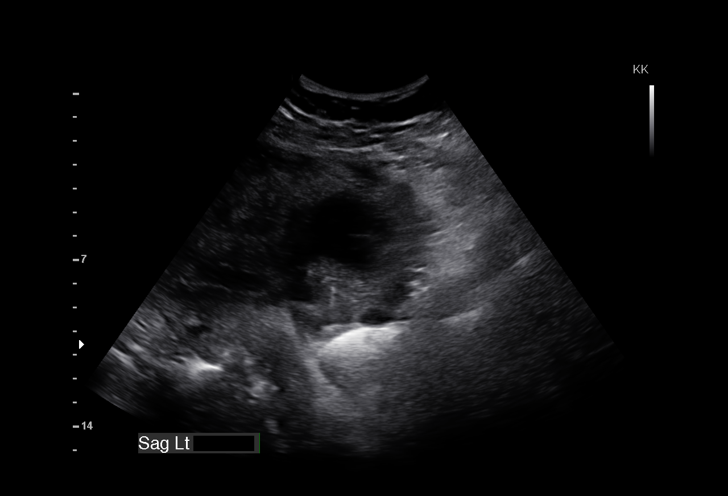
[im 6/53]
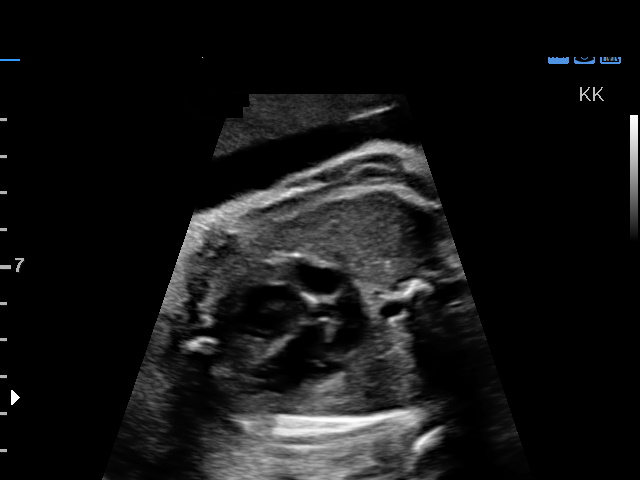
[im 10/53]
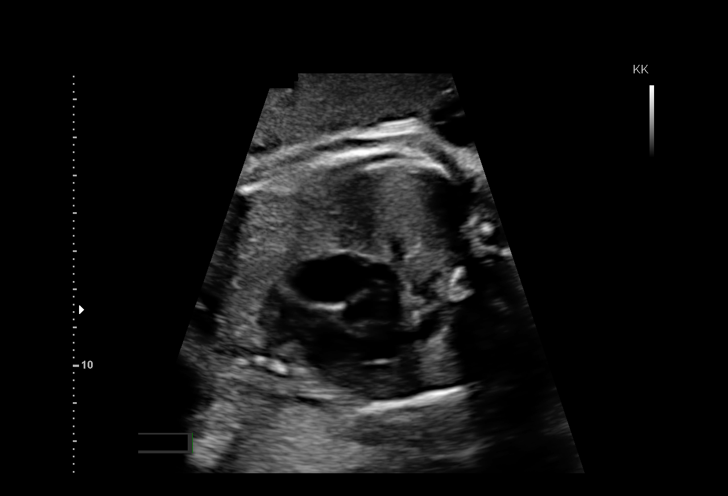
[im 14/53]
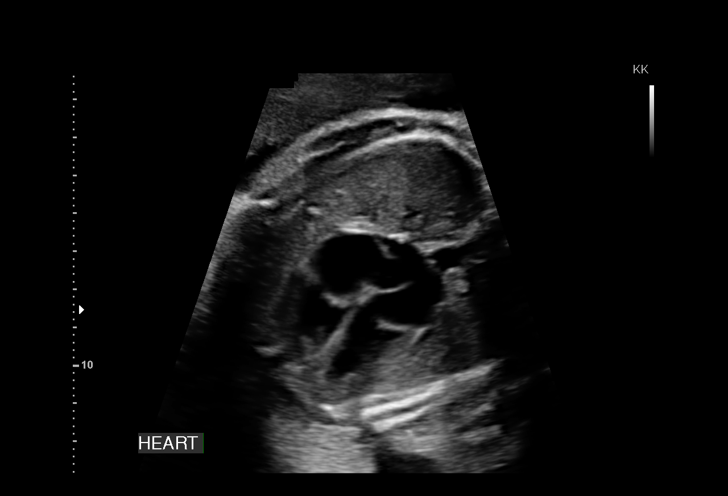
[im 18/53]
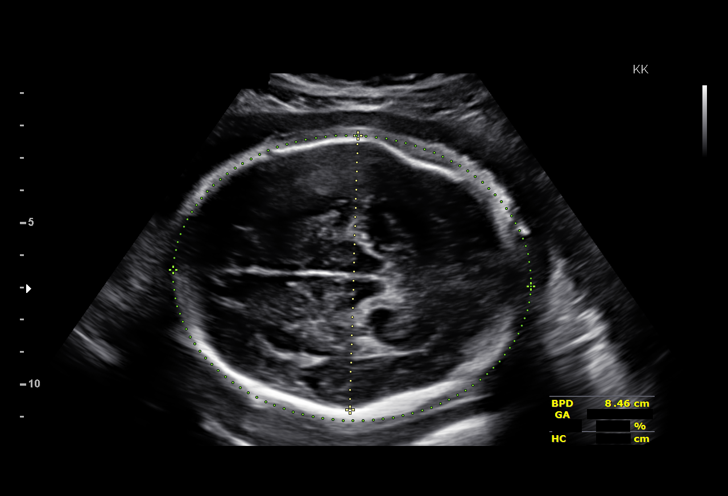
[im 22/53]
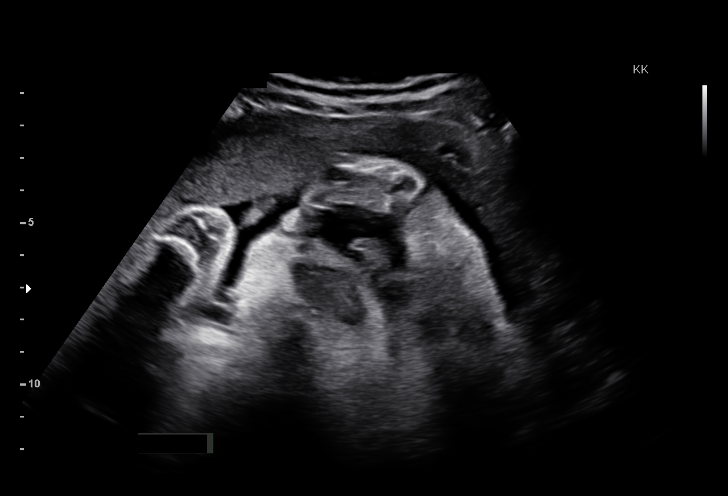
[im 27/53]
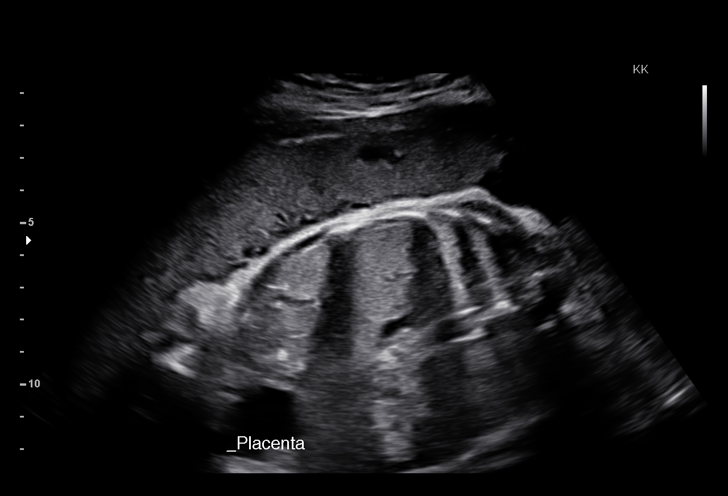
[im 31/53]
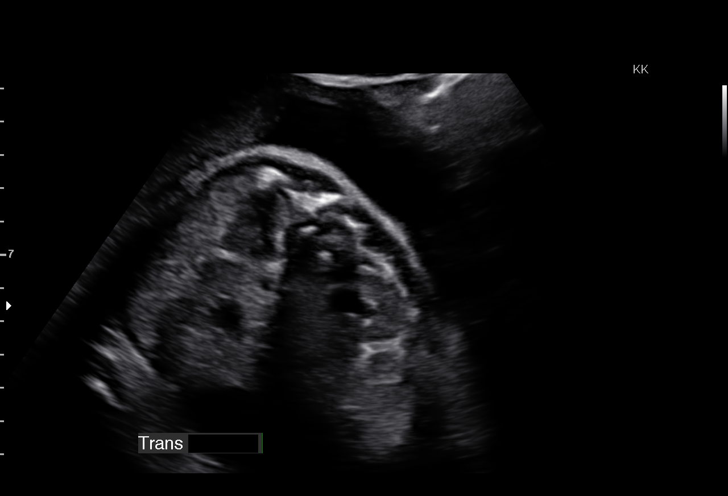
[im 35/53]
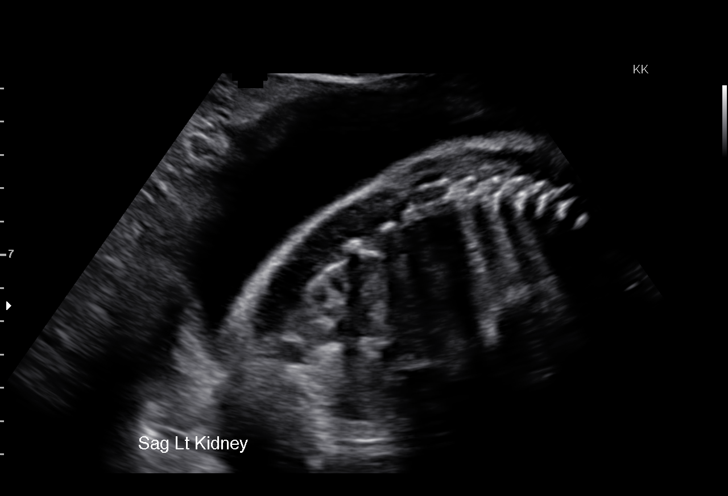
[im 39/53]
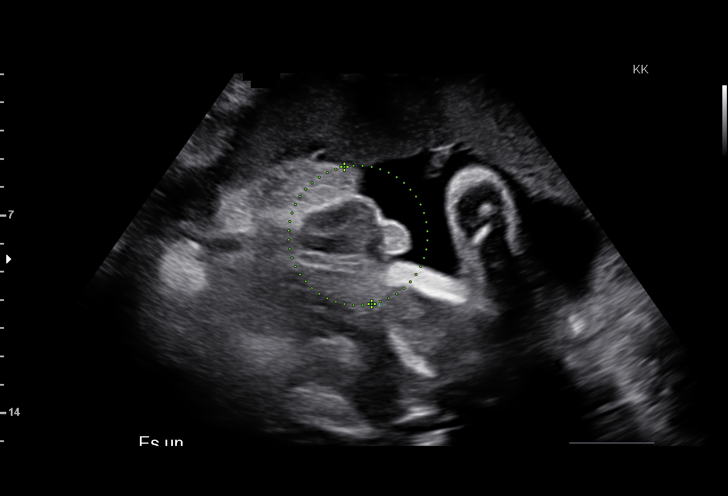
[im 43/53]
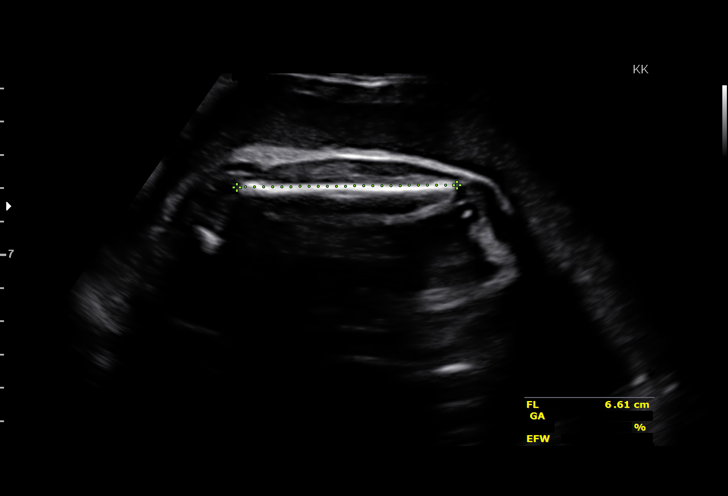
[im 47/53]
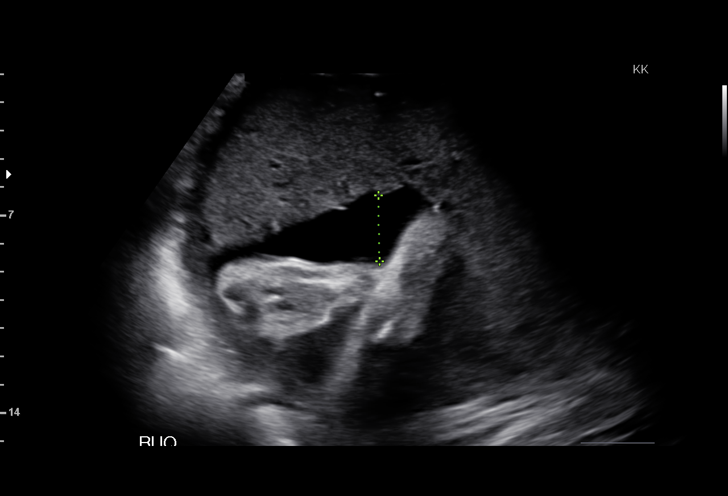
[im 51/53]
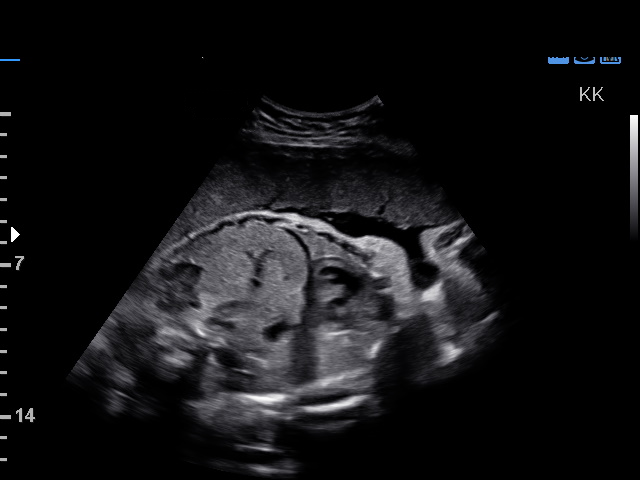

[13 of 28 positions shown; findings below may reference images not displayed]

OBSTETRICS REPORT
(Corrected Final 07/08/2015 [DATE])

Date:

Faculty Physician
Service(s) Provided

Indications

Advanced maternal age multigravida (39), second
trimester - low risk QUAD screen
Poor obstetric history: Previous IUFD (30 weeks)
Poor obstetric history: Previous midtrimester loss
(?16 weeks, twins)
Poor obstetric history: Previous gestational
diabetes
Poor obstetric history: Previous macrosomia
[id number]
34 weeks gestation of pregnancy
Fetal Evaluation

Num Of             1
Fetuses:
Fetal Heart        140                          bpm
Rate:
Cardiac Activity:  Observed
Presentation:      Cephalic
Placenta:          Anterior, above cervical
os
P. Cord            Previously Visualized
Insertion:

Amniotic Fluid
AFI FV:      Subjectively within normal limits
AFI Sum:     12.84    cm      40  %Tile     Larg Pckt:    4.47   cm
RUQ:   4.47    cm    RLQ:   1.83    cm   LUQ:    2.33    cm   LLQ:    4.21   cm
Biophysical Evaluation

Amniotic F.V:   Pocket => 2 cm two          F. Tone:        Observed
planes
F. Movement:    Observed                    Score:          [DATE]
F. Breathing:   Observed
Biometry

BPD:     84.9   m    G. Age:   34w 1d                 CI:        74.75   70 - 86
m
FL/HC:      20.9   19.4 -
21.8
HC:     311.6   m    G. Age:   34w 6d        29  %    HC/AC:      0.95   0.96 -
m
AC:     327.7   m    G. Age:   36w 5d      > 97  %    FL/BPD      76.7   71 - 87
m                                     :
FL:      65.1   m    G. Age:   33w 4d        23  %    FL/AC:      19.9   20 - 24
m

Est.        1777   gm   5 lb 14 oz      79   %
FW:
Gestational Age

LMP:           34w 2d        Date:  11/10/14                  EDD:   08/17/15
U/S Today:     34w 6d                                         EDD:   08/13/15
Best:          34w 2d    Det. By:   LMP  (11/10/14)           EDD:   08/17/15
Anatomy

Cranium:          Appears normal         Aortic Arch:       Previously seen
Fetal Cavum:      Previously seen        Ductal Arch:       Previously seen
Ventricles:       Appears normal         Diaphragm:         Appears normal
Choroid Plexus:   Previously seen        Stomach:           Appears normal,
left sided
Cerebellum:       Previously seen        Abdomen:           Appears normal
Posterior         Previously seen        Abdominal          Previously seen
Fossa:                                   Wall:
Nuchal Fold:      Previously seen        Cord Vessels:      Previously seen
Face:             Orbits and profile     Kidneys:           Left sided
previously seen                           pyelectasis,
mm
Lips:             Appears normal         Bladder:           Appears normal
Heart:            Appears normal         Spine:             Previously seen
(4CH, axis, and
situs)
RVOT:             Appears normal         Lower              Previously seen
Extremities:
LVOT:             Appears normal         Upper              Previously seen
Extremities:

Other:   Male gender. Heels, 5th digit, and Nasal bone previously visualized.
Cervix Uterus Adnexa

Cervix:       Not visualized (advanced GA >91wks)

Adnexa:     No abnormality visualized.
Impression

Single IUP at 34w 2d
Advanced maternal age > 40, hx of third trimester IUFD, ?
new diagnosis of GDM
The estimated fetal weight today is at the 79th %tile.  The
AC measures > 97th %tile.
Mild / borderline left urinary tract dilation was noted.  The
renal pelvis measured 7.1 mm.  No calyceal dilation is
noted.
BPP [DATE]
Normal amniotic fluid volume
Recommendations

Recommend antenatal testing (2x weekly NSTs with
weekly AFIs) due to previous third trimester IUFD and AMA
> 40.
Notify Peds at time of delivery regarding left urinary tract
dilation.  Will need imaging studies of the newborn kidneys
after delivery.
Delivery at 39 weeks in the absence of other complications

## 2017-05-20 ENCOUNTER — Encounter (HOSPITAL_COMMUNITY): Payer: Self-pay

## 2017-06-07 ENCOUNTER — Ambulatory Visit: Payer: Self-pay | Admitting: Physician Assistant

## 2017-06-16 ENCOUNTER — Inpatient Hospital Stay (HOSPITAL_COMMUNITY)
Admission: AD | Admit: 2017-06-16 | Discharge: 2017-06-16 | Disposition: A | Discharge status: Home / Self Care | Payer: Self-pay | Source: Ambulatory Visit | Attending: Obstetrics and Gynecology | Admitting: Obstetrics and Gynecology

## 2017-06-16 ENCOUNTER — Encounter (HOSPITAL_COMMUNITY): Payer: Self-pay

## 2017-06-16 ENCOUNTER — Inpatient Hospital Stay (HOSPITAL_COMMUNITY): Payer: Self-pay

## 2017-06-16 DIAGNOSIS — O4692 Antepartum hemorrhage, unspecified, second trimester: Secondary | ICD-10-CM

## 2017-06-16 DIAGNOSIS — Z8632 Personal history of gestational diabetes: Secondary | ICD-10-CM | POA: Insufficient documentation

## 2017-06-16 DIAGNOSIS — O209 Hemorrhage in early pregnancy, unspecified: Secondary | ICD-10-CM

## 2017-06-16 DIAGNOSIS — Z3A01 Less than 8 weeks gestation of pregnancy: Secondary | ICD-10-CM | POA: Insufficient documentation

## 2017-06-16 DIAGNOSIS — O09521 Supervision of elderly multigravida, first trimester: Secondary | ICD-10-CM | POA: Insufficient documentation

## 2017-06-16 DIAGNOSIS — Z881 Allergy status to other antibiotic agents status: Secondary | ICD-10-CM | POA: Insufficient documentation

## 2017-06-16 DIAGNOSIS — O200 Threatened abortion: Secondary | ICD-10-CM | POA: Insufficient documentation

## 2017-06-16 LAB — URINALYSIS, ROUTINE W REFLEX MICROSCOPIC
Bacteria, UA: NONE SEEN
Bilirubin Urine: NEGATIVE
GLUCOSE, UA: NEGATIVE mg/dL
KETONES UR: NEGATIVE mg/dL
Nitrite: NEGATIVE
PH: 5 (ref 5.0–8.0)
Protein, ur: NEGATIVE mg/dL
SPECIFIC GRAVITY, URINE: 1.027 (ref 1.005–1.030)

## 2017-06-16 LAB — CBC
HEMATOCRIT: 38.6 % (ref 36.0–46.0)
HEMOGLOBIN: 13.4 g/dL (ref 12.0–15.0)
MCH: 30.9 pg (ref 26.0–34.0)
MCHC: 34.7 g/dL (ref 30.0–36.0)
MCV: 88.9 fL (ref 78.0–100.0)
Platelets: 204 10*3/uL (ref 150–400)
RBC: 4.34 MIL/uL (ref 3.87–5.11)
RDW: 12.5 % (ref 11.5–15.5)
WBC: 6.8 10*3/uL (ref 4.0–10.5)

## 2017-06-16 LAB — WET PREP, GENITAL
CLUE CELLS WET PREP: NONE SEEN
SPERM: NONE SEEN
Trich, Wet Prep: NONE SEEN
Yeast Wet Prep HPF POC: NONE SEEN

## 2017-06-16 LAB — POCT PREGNANCY, URINE
PREG TEST UR: POSITIVE — AB
Preg Test, Ur: POSITIVE — AB

## 2017-06-16 LAB — HCG, QUANTITATIVE, PREGNANCY: hCG, Beta Chain, Quant, S: 3054 m[IU]/mL — ABNORMAL HIGH (ref <5)

## 2017-06-16 MED ORDER — TRAMADOL HCL 50 MG PO TABS: 50.0000 mg | ORAL_TABLET | Freq: Four times a day (QID) | ORAL | 0 refills | Status: DC | PRN

## 2017-06-16 NOTE — MAU Provider Note (Signed)
Chief Complaint: Abdominal Pain and Vaginal Bleeding   First Provider Initiated Contact with Patient 06/16/17 1341      SUBJECTIVE HPI: Misty Gamble is a 41 y.o. [id number] at [redacted]w[redacted]d by sure LMP who presents to maternity admissions reporting onset of spotting 3 days ago with increase in bleeding and new onset pain yesterday.  She reports the bleeding was light, spotting only, and pink 3 days ago, then yesterday bleeding became heavier, like a light period. She has used 3 pads today for the bleeding.  It is associated with abdominal cramping low in her abdomen, mostly on her left lower side that is intermittent and worsening over time.  It does not radiate.  She tried Tylenol but it did not help.  There are no other associated symptoms. She denies vaginal itching/burning, urinary symptoms, h/a, dizziness, n/v, or fever/chills.     HPI  Past Medical History:  Diagnosis Date  . Aortic heart murmur on examination 08/19/2012   No previous Echo   . Elevated liver enzymes 08/19/2012  . Gestational diabetes   . Gestational diabetes mellitus 06/02/2015   Versus pre-gestation as early 1-hr 150; 3-hour 97/109/162/130 (per phone contact with HD, actual 3-hour results to be sent)     Past Surgical History:  Procedure Laterality Date  . CHOLECYSTECTOMY     Social History   Social History  . Marital status: Married    Spouse name: N/A  . Number of children: N/A  . Years of education: N/A   Occupational History  . Not on file.   Social History Main Topics  . Smoking status: Never Smoker  . Smokeless tobacco: Never Used  . Alcohol use No  . Drug use: No  . Sexual activity: No   Other Topics Concern  . Not on file   Social History Narrative  . No narrative on file   No current facility-administered medications on file prior to encounter.    Current Outpatient Prescriptions on File Prior to Encounter  Medication Sig Dispense Refill  . Prenatal Vit-Fe Fumarate-FA (PRENATAL  MULTIVITAMIN) TABS tablet Take 1 tablet by mouth daily at 12 noon.     Allergies  Allergen Reactions  . Ciprofloxacin Rash    ROS:  Review of Systems  Constitutional: Negative for chills, fatigue and fever.  Respiratory: Negative for shortness of breath.   Cardiovascular: Negative for chest pain.  Gastrointestinal: Positive for abdominal pain. Negative for nausea and vomiting.  Genitourinary: Positive for pelvic pain and vaginal bleeding. Negative for difficulty urinating, dysuria, flank pain, vaginal discharge and vaginal pain.  Neurological: Negative for dizziness and headaches.  Psychiatric/Behavioral: Negative.      I have reviewed patient's Past Medical Hx, Surgical Hx, Family Hx, Social Hx, medications and allergies.   Physical Exam   Patient Vitals for the past 24 hrs:  BP Temp Temp src Pulse  06/16/17 1251 123/77 - - -  06/16/17 1246 (!) 119/91 97.7 F (36.5 C) Oral 84   Constitutional: Well-developed, well-nourished female in no acute distress.  Cardiovascular: normal rate Respiratory: normal effort GI: Abd soft, non-tender. Pos BS x 4 MS: Extremities nontender, no edema, normal ROM Neurologic: Alert and oriented x 4.  GU: Neg CVAT.  PELVIC EXAM: Cervix pink, visually closed, without lesion, small amount light red bleeding mixed with mucus, vaginal walls and external genitalia normal Bimanual exam: Cervix 0/long/high, firm, anterior, neg CMT, uterus  Mildly tender, slightly enlarged, adnexa without tenderness, enlargement, or mass  Unable to obtain FHT by doppler  LAB RESULTS Results for orders placed or performed during the hospital encounter of 06/16/17 (from the past 24 hour(s))  Urinalysis, Routine w reflex microscopic     Status: Abnormal   Collection Time: 06/16/17 12:31 PM  Result Value Ref Range   Color, Urine YELLOW YELLOW   APPearance HAZY (A) CLEAR   Specific Gravity, Urine 1.027 1.005 - 1.030   pH 5.0 5.0 - 8.0   Glucose, UA NEGATIVE NEGATIVE  mg/dL   Hgb urine dipstick SMALL (A) NEGATIVE   Bilirubin Urine NEGATIVE NEGATIVE   Ketones, ur NEGATIVE NEGATIVE mg/dL   Protein, ur NEGATIVE NEGATIVE mg/dL   Nitrite NEGATIVE NEGATIVE   Leukocytes, UA TRACE (A) NEGATIVE   RBC / HPF TOO NUMEROUS TO COUNT 0 - 5 RBC/hpf   WBC, UA 0-5 0 - 5 WBC/hpf   Bacteria, UA NONE SEEN NONE SEEN   Squamous Epithelial / LPF 0-5 (A) NONE SEEN   Mucus PRESENT   Wet prep, genital     Status: Abnormal   Collection Time: 06/16/17  1:30 PM  Result Value Ref Range   Yeast Wet Prep HPF POC NONE SEEN NONE SEEN   Trich, Wet Prep NONE SEEN NONE SEEN   Clue Cells Wet Prep HPF POC NONE SEEN NONE SEEN   WBC, Wet Prep HPF POC MANY (A) NONE SEEN   Sperm NONE SEEN   CBC     Status: None   Collection Time: 06/16/17  2:01 PM  Result Value Ref Range   WBC 6.8 4.0 - 10.5 K/uL   RBC 4.34 3.87 - 5.11 MIL/uL   Hemoglobin 13.4 12.0 - 15.0 g/dL   HCT 38.6 36.0 - 46.0 %   MCV 88.9 78.0 - 100.0 fL   MCH 30.9 26.0 - 34.0 pg   MCHC 34.7 30.0 - 36.0 g/dL   RDW 12.5 11.5 - 15.5 %   Platelets 204 150 - 400 K/uL  hCG, quantitative, pregnancy     Status: Abnormal   Collection Time: 06/16/17  2:01 PM  Result Value Ref Range   hCG, Beta Chain, Quant, S 3,054 (H) <5 mIU/mL    --/--/O POS (11/20 0353)  IMAGING US Ob Comp Less 14 Wks  Result Date: 06/16/2017 CLINICAL DATA:  Pregnant patient with vaginal bleeding and cramping. EXAM: OBSTETRIC <14 WK [place] AND TRANSVAGINAL OB US TECHNIQUE: Both transabdominal and transvaginal ultrasound examinations were performed for complete evaluation of the gestation as well as the maternal uterus, adnexal regions, and pelvic cul-de-sac. Transvaginal technique was performed to assess early pregnancy. COMPARISON:  None. FINDINGS: Intrauterine gestational sac: Single visualized. Gestational sac is oblong in shape and positioned in the lower uterine segment. Yolk sac:  Visualized. Embryo:  Not visualized. Cardiac Activity: Not applicable. MSD: 5.6   mm   5 w   2  d       [place] EDC: 12/06/2017. Subchorionic hemorrhage:  None visualized. Maternal uterus/adnexae: Unremarkable. IMPRESSION: [place] gestational sac in the lower uterine segment is worrisome but not yet definitive for failed pregnancy. Recommend follow-up US in 10-14 days for definitive diagnosis. This recommendation follows SRU consensus guidelines: Diagnostic Criteria for Nonviable Pregnancy Early in the First Trimester. [place] Med 2013; 381:0175-10. Electronically Signed   By: Inge Rise M.D.   On: 06/16/2017 15:17   US Ob Transvaginal  Result Date: 06/16/2017 CLINICAL DATA:  Pregnant patient with vaginal bleeding and cramping. EXAM: OBSTETRIC <14 WK [place] AND TRANSVAGINAL OB US TECHNIQUE: Both transabdominal and transvaginal ultrasound examinations were performed  for complete evaluation of the gestation as well as the maternal uterus, adnexal regions, and pelvic cul-de-sac. Transvaginal technique was performed to assess early pregnancy. COMPARISON:  None. FINDINGS: Intrauterine gestational sac: Single visualized. Gestational sac is oblong in shape and positioned in the lower uterine segment. Yolk sac:  Visualized. Embryo:  Not visualized. Cardiac Activity: Not applicable. MSD: 5.6  mm   5 w   2  d       [place] EDC: 12/06/2017. Subchorionic hemorrhage:  None visualized. Maternal uterus/adnexae: Unremarkable. IMPRESSION: [place] gestational sac in the lower uterine segment is worrisome but not yet definitive for failed pregnancy. Recommend follow-up US in 10-14 days for definitive diagnosis. This recommendation follows SRU consensus guidelines: Diagnostic Criteria for Nonviable Pregnancy Early in the First Trimester. [place] [employer] Med 2013; 284:1324-40. Electronically Signed   By: Inge Rise M.D.   On: 06/16/2017 15:17    MAU Management/MDM: Ordered labs and reviewed results.  IUP noted on today's [place] with visible yolk sac.  With vaginal bleeding and gestational sac of early gestation, not c/w  LMP dates, and in lower uterine segment, likely SAB in progress. This was discussed with pt in detail today. But, findings today are not definitive.  Bleeding precautions reviewed. Outpatient [place] in 10 days ordered.  Return to MAU with increased pain or bleeding. Pt requests something for pain to help her sleep, Tylenol has not helped.  Tramadol 50 mg Q 6 hours PRN x 10 tabs.  Pt stable at time of discharge.  ASSESSMENT 1. Vaginal bleeding in pregnancy, first trimester   2. Vaginal bleeding in pregnancy, second trimester   3. Threatened miscarriage in early pregnancy     PLAN Discharge home with bleeding precautions  Allergies as of 06/16/2017      Reactions   Ciprofloxacin Rash      Medication List    STOP taking these medications   naproxen 500 MG tablet Commonly known as:  NAPROSYN   pantoprazole 20 MG tablet Commonly known as:  PROTONIX     TAKE these medications   prenatal multivitamin Tabs tablet Take 1 tablet by mouth daily at 12 noon.   traMADol 50 MG tablet Commonly known as:  ULTRAM Take 1 tablet (50 mg total) by mouth every 6 (six) hours as needed. What changed:  how much to take  reasons to take this      [place] Follow up.   Specialty:  Radiology Why:  Ultrasound will call you to schedule more imaging in 10 days. Return to MAU as needed for emergencies.  Contact information: [address] [id number] [place] Certified Nurse-Midwife 06/16/2017  4:04 PM

## 2017-06-16 NOTE — MAU Note (Signed)
Patient presents with onset of vaginal bleeding Thursday very small amount with no pain, then last night started having cramping and more bleeding. Has gone through 3 pads since last night.

## 2017-06-17 LAB — HIV ANTIBODY (ROUTINE TESTING W REFLEX): HIV SCREEN 4TH GENERATION: NONREACTIVE

## 2017-06-18 LAB — GC/CHLAMYDIA PROBE AMP (~~LOC~~) NOT AT ARMC
CHLAMYDIA, DNA PROBE: NEGATIVE
NEISSERIA GONORRHEA: NEGATIVE

## 2017-08-06 ENCOUNTER — Inpatient Hospital Stay (HOSPITAL_COMMUNITY): Payer: Self-pay

## 2017-08-06 ENCOUNTER — Inpatient Hospital Stay (HOSPITAL_COMMUNITY)
Admission: AD | Admit: 2017-08-06 | Discharge: 2017-08-06 | Disposition: A | Discharge status: Home / Self Care | Payer: Self-pay | Source: Ambulatory Visit | Attending: Family Medicine | Admitting: Family Medicine

## 2017-08-06 ENCOUNTER — Encounter (HOSPITAL_COMMUNITY): Payer: Self-pay | Admitting: *Deleted

## 2017-08-06 DIAGNOSIS — O3680X0 Pregnancy with inconclusive fetal viability, not applicable or unspecified: Secondary | ICD-10-CM | POA: Insufficient documentation

## 2017-08-06 DIAGNOSIS — Z3A00 Weeks of gestation of pregnancy not specified: Secondary | ICD-10-CM | POA: Insufficient documentation

## 2017-08-06 DIAGNOSIS — O209 Hemorrhage in early pregnancy, unspecified: Secondary | ICD-10-CM | POA: Insufficient documentation

## 2017-08-06 DIAGNOSIS — N83201 Unspecified ovarian cyst, right side: Secondary | ICD-10-CM

## 2017-08-06 LAB — CBC
HCT: 39.8 % (ref 36.0–46.0)
Hemoglobin: 13.5 g/dL (ref 12.0–15.0)
MCH: 30.5 pg (ref 26.0–34.0)
MCHC: 33.9 g/dL (ref 30.0–36.0)
MCV: 89.8 fL (ref 78.0–100.0)
PLATELETS: 219 10*3/uL (ref 150–400)
RBC: 4.43 MIL/uL (ref 3.87–5.11)
RDW: 12.6 % (ref 11.5–15.5)
WBC: 6.3 10*3/uL (ref 4.0–10.5)

## 2017-08-06 LAB — URINALYSIS, ROUTINE W REFLEX MICROSCOPIC
BILIRUBIN URINE: NEGATIVE
GLUCOSE, UA: NEGATIVE mg/dL
Hgb urine dipstick: NEGATIVE
KETONES UR: NEGATIVE mg/dL
Leukocytes, UA: NEGATIVE
NITRITE: NEGATIVE
PH: 7 (ref 5.0–8.0)
Protein, ur: NEGATIVE mg/dL
SPECIFIC GRAVITY, URINE: 1.024 (ref 1.005–1.030)

## 2017-08-06 LAB — HCG, QUANTITATIVE, PREGNANCY: hCG, Beta Chain, Quant, S: <1 m[IU]/mL (ref <5)

## 2017-08-06 NOTE — MAU Provider Note (Signed)
History     CSN: [id number]  Arrival date and time: 08/06/17 1419   First Provider Initiated Contact with Patient 08/06/17 1452      Chief Complaint  Patient presents with  . Vaginal Bleeding   [id number] @12 .4 wks sent here from [facility] for pregnancy verification. She reports VB like a period in early November, but this did not concern her because "I bleed with all my pregnancies". Denies abdominal or pelvic pain. She was seen 2 months ago in MAU for VB and US showed irregular IUGS in LUS with YS but no FP. She was to have f/u US but was unaware of this.    OB History    Gravida Para Term Preterm AB Living   11 6 6  0 4 6   SAB TAB Ectopic Multiple Live Births   4     0 6      Past Medical History:  Diagnosis Date  . Aortic heart murmur on examination 08/19/2012   No previous Echo   . Elevated liver enzymes 08/19/2012  . Gestational diabetes   . Gestational diabetes mellitus 06/02/2015   Versus pre-gestation as early 1-hr 150; 3-hour 97/109/162/130 (per phone contact with HD, actual 3-hour results to be sent)      Past Surgical History:  Procedure Laterality Date  . CHOLECYSTECTOMY      No family history on file.  Social History   Tobacco Use  . Smoking status: Never Smoker  . Smokeless tobacco: Never Used  Substance Use Topics  . Alcohol use: No  . Drug use: No    Allergies:  Allergies  Allergen Reactions  . Ciprofloxacin Rash    Medications Prior to Admission  Medication Sig Dispense Refill Last Dose  . Prenatal Vit-Fe Fumarate-FA (PRENATAL MULTIVITAMIN) TABS tablet Take 1 tablet by mouth daily at 12 noon.   Past Month at Unknown time  . traMADol (ULTRAM) 50 MG tablet Take 1 tablet (50 mg total) by mouth every 6 (six) hours as needed. 10 tablet 0     Review of Systems  Gastrointestinal: Negative for abdominal pain.  Genitourinary: Negative for vaginal bleeding.   Physical Exam   Blood pressure 110/74, pulse 68, temperature 98.3 F (36.8 C),  temperature source Oral, resp. rate 16, height 5\' 1"  (1.549 m), weight 158 lb (71.7 kg), last menstrual period 03/03/2017, SpO2 100 %, unknown if currently breastfeeding.  Physical Exam  Nursing note and vitals reviewed. Constitutional: She is oriented to person, place, and time. She appears well-developed and well-nourished.  HENT:  Head: Normocephalic and atraumatic.  Neck: Normal range of motion.  Cardiovascular: Normal rate.  Respiratory: Effort normal. No respiratory distress.  GI: Soft. She exhibits no distension and no mass. There is no tenderness. There is no rebound and no guarding.  Genitourinary:  Genitourinary Comments: External: no lesions or erythema Vagina: rugated, pink, moist, scant white discharge Uterus: non enlarged, anteverted, non tender, no CMT Adnexae: no masses, no tenderness left, no tenderness right   Musculoskeletal: Normal range of motion.  Neurological: She is alert and oriented to person, place, and time.  Skin: Skin is warm and dry.  Psychiatric: She has a normal mood and affect.   Bedside US: uterus not seen  US Ob Transvaginal  Result Date: 08/06/2017 CLINICAL DATA:  Pregnancy of inconclusive fetal viability EXAM: TRANSVAGINAL OB ULTRASOUND TECHNIQUE: Transvaginal ultrasound was performed for complete evaluation of the gestation as well as the maternal uterus, adnexal regions, and pelvic cul-de-sac. COMPARISON:  06/16/2017 FINDINGS:  Intrauterine gestational sac: None Yolk sac:  N/A Embryo:  N/A Cardiac Activity: N/A Heart Rate: N/A bpm Maternal uterus/adnexae: Previously identified elongated gestational sac at the lower uterine segment is no longer identified consistent with spontaneous abortion. No focal endometrial abnormalities. RIGHT ovary measures 3.0 x 3.3 x 1.8 cm and contains a small mildly complicated cystic nodule question corpus luteum. LEFT ovary is not visualized question obscured by bowel. No free pelvic fluid or adnexal masses IMPRESSION:  Previously identified gestational sac at the lower uterine segment is no longer seen consistent with spontaneous abortion. No definite acute intrapelvic abnormalities otherwise identified. Electronically Signed   By: Lavonia Dana M.D.   On: 08/06/2017 17:10   Results for orders placed or performed during the hospital encounter of 08/06/17 (from the past 24 hour(s))  Urinalysis, Routine w reflex microscopic     Status: Abnormal   Collection Time: 08/06/17  2:30 PM  Result Value Ref Range   Color, Urine YELLOW YELLOW   APPearance HAZY (A) CLEAR   Specific Gravity, Urine 1.024 1.005 - 1.030   pH 7.0 5.0 - 8.0   Glucose, UA NEGATIVE NEGATIVE mg/dL   Hgb urine dipstick NEGATIVE NEGATIVE   Bilirubin Urine NEGATIVE NEGATIVE   Ketones, ur NEGATIVE NEGATIVE mg/dL   Protein, ur NEGATIVE NEGATIVE mg/dL   Nitrite NEGATIVE NEGATIVE   Leukocytes, UA NEGATIVE NEGATIVE  CBC     Status: None   Collection Time: 08/06/17  3:58 PM  Result Value Ref Range   WBC 6.3 4.0 - 10.5 K/uL   RBC 4.43 3.87 - 5.11 MIL/uL   Hemoglobin 13.5 12.0 - 15.0 g/dL   HCT 39.8 36.0 - 46.0 %   MCV 89.8 78.0 - 100.0 fL   MCH 30.5 26.0 - 34.0 pg   MCHC 33.9 30.0 - 36.0 g/dL   RDW 12.6 11.5 - 15.5 %   Platelets 219 150 - 400 K/uL  hCG, quantitative, pregnancy     Status: None   Collection Time: 08/06/17  3:58 PM  Result Value Ref Range   hCG, Beta Chain, Quant, S <1 <5 mIU/mL   MAU Course  Procedures  MDM Labs and [place] ordered and reviewed. No evidence of pregnancy on [place] therefore previous pregnancy was SAB. Will obtain f/u US for ovarian cyst. Discussed findings with pt. Stable for discharge home.  Assessment and Plan  Rt ovarian cyst Discharge home Follow up in 4 weeks for US-ordered Follow up in 6 weeks in [place]   Allergies as of 08/06/2017      Reactions   Ciprofloxacin Rash      Medication List    STOP taking these medications   traMADol 50 MG tablet Commonly known as:  ULTRAM     TAKE these medications    prenatal multivitamin Tabs tablet Take 1 tablet by mouth daily at 12 noon.      Julianne Handler, CNM 08/06/2017, 3:05 PM

## 2017-08-06 NOTE — Discharge Instructions (Signed)
Quiste ovrico (Ovarian Cyst) Un quiste ovrico es un saco lleno de lquido o [place]. Este saco est unido al ovario. Algunos quistes desaparecen solos. Otros quistes necesitan tratamiento. CUIDADOS EN EL HOGAR  Solo tome los medicamentos que le haya indicado su mdico.  Concurra a las consultas de control con el mdico, segn las indicaciones.  Hgase exmenes plvicos regulares y pruebas de Papanicolaou.  SOLICITE AYUDA SI:  Sus perodos se atrasan o son irregulares o dolorosos.  Sus perodos cesan.  Siente dolor en el vientre (abdominal) o en la pelvis que no desaparece.  El abdomen se agranda o se inflama hincha.  Tiene dificultades para orinar (vaciar por completo la vejiga).  Siente presin en la vejiga.  Siente dolor durante las [employer].  Siente distensin, presin o Manufacturing systems engineer.  Pierde peso sin ningn motivo.  Se siente mal constantemente.  Tiene dificultades para defecar (estreimiento).  No tiene ganas de comer.  Le aparecen granos (acn).  Nota un aumento del vello corporal y facial.  [place] de peso sin motivo.  Sospecha que est embarazada.  SOLICITE AYUDA DE INMEDIATO SI:  El dolor en el vientre empeora.  Tiene Higher education careers adviser (nuseas) y vomita.  Le sube la fiebre rpidamente.  Le duele el estmago mientras defeca.  Los perodos menstruales son ms abundantes de lo habitual.  ASEGRESE DE QUE:  Comprende estas instrucciones.  Controlar su afeccin.  Recibir ayuda de inmediato si no mejora o si empeora.  Esta informacin no tiene Marine scientist el consejo del mdico. Asegrese de hacerle al mdico cualquier pregunta que tenga. Document Released: 06/11/2013 Document Revised: 01/23/2016 Document Reviewed: 01/23/2016 Elsevier Interactive Patient Education  2017 [employer].

## 2017-08-06 NOTE — MAU Note (Signed)
Pt reports she was seen here in early pregnancy for bleeding and pain,was trying to apply for medicaid and the [facility] told her they would need to have proof that she was still preg because the visit here showed a miscarriage. Pt states she has had some bleeding but she feels like she is still pregnant.

## 2017-08-31 ENCOUNTER — Ambulatory Visit: Payer: Self-pay | Admitting: Obstetrics & Gynecology

## 2017-09-24 ENCOUNTER — Ambulatory Visit: Payer: Self-pay

## 2017-09-27 ENCOUNTER — Inpatient Hospital Stay (HOSPITAL_COMMUNITY)
Admission: AD | Admit: 2017-09-27 | Discharge: 2017-09-27 | Disposition: A | Discharge status: Home / Self Care | Payer: Self-pay | Source: Ambulatory Visit | Attending: Obstetrics and Gynecology | Admitting: Obstetrics and Gynecology

## 2017-09-27 ENCOUNTER — Encounter: Payer: Self-pay | Admitting: Medical

## 2017-09-27 DIAGNOSIS — O039 Complete or unspecified spontaneous abortion without complication: Secondary | ICD-10-CM | POA: Insufficient documentation

## 2017-09-27 LAB — CBC WITH DIFFERENTIAL/PLATELET
Basophils Absolute: 0 10*3/uL (ref 0.0–0.1)
Basophils Relative: 1 %
EOS PCT: 5 %
Eosinophils Absolute: 0.3 10*3/uL (ref 0.0–0.7)
HEMATOCRIT: 37.3 % (ref 36.0–46.0)
Hemoglobin: 13.3 g/dL (ref 12.0–15.0)
LYMPHS ABS: 2.3 10*3/uL (ref 0.7–4.0)
LYMPHS PCT: 36 %
MCH: 31.4 pg (ref 26.0–34.0)
MCHC: 35.7 g/dL (ref 30.0–36.0)
MCV: 88.2 fL (ref 78.0–100.0)
MONO ABS: 0.2 10*3/uL (ref 0.1–1.0)
MONOS PCT: 3 %
NEUTROS ABS: 3.5 10*3/uL (ref 1.7–7.7)
Neutrophils Relative %: 55 %
Platelets: 217 10*3/uL (ref 150–400)
RBC: 4.23 MIL/uL (ref 3.87–5.11)
RDW: 12.4 % (ref 11.5–15.5)
WBC: 6.3 10*3/uL (ref 4.0–10.5)

## 2017-09-27 LAB — POCT PREGNANCY, URINE: Preg Test, Ur: POSITIVE — AB

## 2017-09-27 LAB — HCG, QUANTITATIVE, PREGNANCY: hCG, Beta Chain, Quant, S: 895 m[IU]/mL — ABNORMAL HIGH (ref <5)

## 2017-09-27 NOTE — Discharge Instructions (Signed)
Aborto espontáneo °(Miscarriage) °El aborto espontáneo es la pérdida de un bebé que no ha nacido.(feto) antes de la semana 20 del embarazo. La causa generalmente es desconocida. °CUIDADOS EN EL HOGAR °· Debe permanecer en cama (reposo en cama) o podrá hacer actividades livianas. Regrese a sus actividades según las indicaciones del médico. °· Pida ayuda con las tareas domésticas. °· Anote cuántos apósitos usa por día. Describa el grado en que están empapados. °· No use tampones. No se higienice la vagina (duchas vaginales) ni tenga relaciones sexuales (coito) hasta que el médico la autorice. °· Sólo debe tomar la medicación según las indicaciones del médico. °· No tome aspirina. °· Cumpla con los controles médicos según las indicaciones. °· Si usted o su pareja tienen problemas con el duelo, hable con su médico. También puede intentar con psicoterapia. Permítase el tiempo suficiente de duelo antes de quedar embarazada nuevamente. ° °SOLICITE AYUDA DE INMEDIATO SI: °· Siente cólicos intensos o dolor en el estómago, en la espalda o en el vientre (abdomen). °· Tiene fiebre. °· Elimina grumos de sangre (coágulos) por la vagina, que tienen el tamaño de una nuez o más. Guarde los coágulos para que el médico los vea. °· Elimina gran cantidad de tejidos por la vagina. Guarde lo que ha eliminado para que su médico lo examine. °· Aumenta el sangrado. °· Observa una secreción espesa, con mal olor (pérdida) que proviene de la vagina. °· Se siente mareada, débil o se desvanece (se desmaya). °· Siente escalofríos. ° °ASEGÚRESE DE QUE: °· Comprende estas instrucciones. °· Controlará su enfermedad. °· Solicitará ayuda de inmediato si no mejora o si empeora. ° °Esta información no tiene como fin reemplazar el consejo del médico. Asegúrese de hacerle al médico cualquier pregunta que tenga. °Document Released: 02/20/2012 Document Revised: 02/20/2012 Document Reviewed: 09/21/2011 °Elsevier Interactive Patient Education © 2017 Elsevier  Inc. ° °

## 2017-09-27 NOTE — MAU Note (Signed)
Pt reports she had a miscarriage at home on Saturday, states she just came in today to make sure everything is okay.

## 2017-09-27 NOTE — MAU Note (Signed)
Urine in lab 

## 2017-09-27 NOTE — MAU Provider Note (Signed)
History     CSN: [id number]  Arrival date and time: 09/27/17 1605   First Provider Initiated Contact with Patient 09/27/17 1804      Chief Complaint  Patient presents with  . Miscarriage   HPI [name] is a 42 y.o. [id number] at Unknown who presents to MAU today to be checked after a possible miscarriage this weekend. The patient states that she was pregnant and passed a fetus this weekend. She states very light spotting now and only intermittent mild pain. She denies fever. She just wants to make sure everything is ok. The patient had a confirmed SAB in November and then a negative hCG here on 08/06/17. She had not had a period since that visit.   OB History    Gravida Para Term Preterm AB Living   12 6 6  0 4 6   SAB TAB Ectopic Multiple Live Births   4     0 6      Past Medical History:  Diagnosis Date  . Aortic heart murmur on examination 08/19/2012   No previous Echo   . Elevated liver enzymes 08/19/2012  . Gestational diabetes   . Gestational diabetes mellitus 06/02/2015   Versus pre-gestation as early 1-hr 150; 3-hour 97/109/162/130 (per phone contact with HD, actual 3-hour results to be sent)      Past Surgical History:  Procedure Laterality Date  . CHOLECYSTECTOMY      No family history on file.  Social History   Tobacco Use  . Smoking status: Never Smoker  . Smokeless tobacco: Never Used  Substance Use Topics  . Alcohol use: No  . Drug use: No    Allergies:  Allergies  Allergen Reactions  . Ciprofloxacin Rash    No medications prior to admission.    Review of Systems  Constitutional: Negative for fever.  Gastrointestinal: Positive for abdominal pain. Negative for constipation, diarrhea, nausea and vomiting.  Genitourinary: Positive for vaginal bleeding. Negative for dysuria, frequency, urgency and vaginal discharge.   Physical Exam   Blood pressure 127/84, temperature 98.6 F (37 C), temperature source Oral, resp. rate 16,  height 5\' 1"  (1.549 m), weight 162 lb (73.5 kg), last menstrual period 03/03/2017, SpO2 100 %, unknown if currently breastfeeding.  Physical Exam  Nursing note and vitals reviewed. Constitutional: She is oriented to person, place, and time. She appears well-developed and well-nourished. No distress.  HENT:  Head: Normocephalic and atraumatic.  Cardiovascular: Normal rate.  Respiratory: Effort normal.  GI: Soft. She exhibits no distension and no mass. There is no tenderness. There is no rebound and no guarding.  Neurological: She is alert and oriented to person, place, and time.  Skin: Skin is warm and dry. No erythema.  Psychiatric: She has a normal mood and affect.    Results for orders placed or performed during the hospital encounter of 09/27/17 (from the past 24 hour(s))  Pregnancy, urine POC     Status: Abnormal   Collection Time: 09/27/17  4:43 PM  Result Value Ref Range   Preg Test, Ur POSITIVE (A) NEGATIVE  CBC with Differential/Platelet     Status: None   Collection Time: 09/27/17  4:44 PM  Result Value Ref Range   WBC 6.3 4.0 - 10.5 K/uL   RBC 4.23 3.87 - 5.11 MIL/uL   Hemoglobin 13.3 12.0 - 15.0 g/dL   HCT 37.3 36.0 - 46.0 %   MCV 88.2 78.0 - 100.0 fL   MCH 31.4 26.0 - 34.0 pg  MCHC 35.7 30.0 - 36.0 g/dL   RDW 12.4 11.5 - 15.5 %   Platelets 217 150 - 400 K/uL   Neutrophils Relative % 55 %   Neutro Abs 3.5 1.7 - 7.7 K/uL   Lymphocytes Relative 36 %   Lymphs Abs 2.3 0.7 - 4.0 K/uL   Monocytes Relative 3 %   Monocytes Absolute 0.2 0.1 - 1.0 K/uL   Eosinophils Relative 5 %   Eosinophils Absolute 0.3 0.0 - 0.7 K/uL   Basophils Relative 1 %   Basophils Absolute 0.0 0.0 - 0.1 K/uL  hCG, quantitative, pregnancy     Status: Abnormal   Collection Time: 09/27/17  4:44 PM  Result Value Ref Range   hCG, Beta Chain, Quant, S 895 (H) <5 mIU/mL    MAU Course  Procedures None  MDM +UPT today CBC, hCG today  Discussed patient with Dr. Rosana Hoes. Agrees with plan to repeat  hCG in 48 hours since patient's symptoms have mostly resolved today.   Assessment and Plan  A: Possible miscarriage   P: Discharge home Bleeding/ectopic precautions discussed Patient advised to follow-up in MAU for repeat hCG on Saturday or sooner as needed or if her condition were to change or worsen  Kerry Hough, PA-C 09/27/2017, 7:05 PM

## 2017-09-29 ENCOUNTER — Inpatient Hospital Stay (HOSPITAL_COMMUNITY)
Admission: AD | Admit: 2017-09-29 | Discharge: 2017-09-29 | Disposition: A | Discharge status: Home / Self Care | Payer: Self-pay | Source: Ambulatory Visit | Attending: Obstetrics & Gynecology | Admitting: Obstetrics & Gynecology

## 2017-09-29 ENCOUNTER — Other Ambulatory Visit: Payer: Self-pay

## 2017-09-29 DIAGNOSIS — O039 Complete or unspecified spontaneous abortion without complication: Secondary | ICD-10-CM | POA: Insufficient documentation

## 2017-09-29 LAB — HCG, QUANTITATIVE, PREGNANCY: hCG, Beta Chain, Quant, S: 389 m[IU]/mL — ABNORMAL HIGH (ref <5)

## 2017-09-29 NOTE — MAU Note (Signed)
Pt presents for repeat lab work. Denies pain. Reports that her bleeding has stopped.

## 2017-09-29 NOTE — MAU Provider Note (Signed)
History   Chief Complaint:  Follow up labs for BHCG  Misty Gamble is  42 y.o. [id number] Patient is here for follow up of quantitative HCG.   On August 06, 2017, quant was less than one and on 09-27-17, quant was 895.  Client was seen that day for heavy bleeding and passing of a "fetus" at home - notes from 09-27-17 reviewed.  Blood type is O positive.   Since her last visit, the patient is without new complaint.   The patient reports bleeding as  none now.    General ROS:  No complaints today, no nausea, no vomiting, no diarrhea, no abdominal pain.  See also HPI for additional information  Her previous Quantitative HCG values are: 09-27-17   895    Physical Exam   Blood pressure 122/80, pulse 75, temperature 97.8 F (36.6 C), temperature source Oral, resp. rate 16, height 5\' 1"  (1.549 m), weight 163 lb (73.9 kg), last menstrual period 03/03/2017, SpO2 100 %, unknown if currently breastfeeding.  Focused Exam:  GENERAL: Well-developed, well-nourished female in no acute distress.  HEENT: Normocephalic, atraumatic.  LUNGS: Effort normal  HEART: Regular rate  AbdomenL Soft, nontender SKIN: Warm, dry and without erythema  PSYCH: Normal mood and affect   Labs: Results for orders placed or performed during the hospital encounter of 09/29/17 (from the past 24 hour(s))  hCG, quantitative, pregnancy   Collection Time: 09/29/17  7:53 PM  Result Value Ref Range   hCG, Beta Chain, Quant, S 389 (H) <5 mIU/mL    Ultrasound Studies:   Previous ultrasound reviewed.  No ultrasound done today.  Assessment:  Falling quants which is consistent with earlier diagnosis of miscarriage although no IUP was ever identified - unable to rule out pregnancy of unknown anatomic location.  Plan: The patient is instructed to follow up in clinic downstairs in one week for weekly BHCG levels to follow the hormone levels to zero.  Client is aware of clinic and has the phone number.  She had another  appointment scheduled there later.  Message sent to clinic administrative staff to schedule her for labs on Feb 4 or 5.  Reviewed this information with client.  Continue pelvic rest.  Return if having severe abdominal pain or severe vaginal bleeding.  Client in agreement with this plan.  Misty Gamble Misty Gamble Misty Gamble 09/29/2017, 7:59 PM

## 2017-10-08 ENCOUNTER — Other Ambulatory Visit: Payer: Self-pay

## 2017-10-08 ENCOUNTER — Other Ambulatory Visit: Payer: Self-pay | Admitting: *Deleted

## 2017-10-08 DIAGNOSIS — O039 Complete or unspecified spontaneous abortion without complication: Secondary | ICD-10-CM

## 2017-10-15 ENCOUNTER — Other Ambulatory Visit: Payer: Self-pay

## 2017-10-15 ENCOUNTER — Ambulatory Visit: Payer: Self-pay | Admitting: Nurse Practitioner

## 2017-10-22 ENCOUNTER — Other Ambulatory Visit: Payer: Self-pay

## 2017-12-04 ENCOUNTER — Encounter: Payer: Self-pay | Admitting: Physician Assistant

## 2018-09-26 ENCOUNTER — Encounter (HOSPITAL_COMMUNITY): Payer: Self-pay

## 2018-10-14 ENCOUNTER — Telehealth: Payer: Self-pay | Admitting: Family Medicine

## 2018-10-14 NOTE — Telephone Encounter (Signed)
Copied from [place] [id number]. Topic: Appointment Scheduling - Scheduling Inquiry for Clinic >> Oct 14, 2018 10:47 AM Lionel December wrote: Reason for CRM: Pt wants flu shot, shingles and MMR vaccines. Would like a call back

## 2018-10-14 NOTE — Telephone Encounter (Signed)
Transferred pt to scheduling so she could get an appointment for vaccines.

## 2020-02-12 DIAGNOSIS — H524 Presbyopia: Secondary | ICD-10-CM | POA: Diagnosis not present

## 2020-02-12 DIAGNOSIS — H5203 Hypermetropia, bilateral: Secondary | ICD-10-CM | POA: Diagnosis not present

## 2020-02-12 DIAGNOSIS — H52223 Regular astigmatism, bilateral: Secondary | ICD-10-CM | POA: Diagnosis not present

## 2020-09-28 ENCOUNTER — Other Ambulatory Visit: Payer: Self-pay

## 2020-09-28 ENCOUNTER — Emergency Department (HOSPITAL_COMMUNITY)
Admission: EM | Admit: 2020-09-28 | Discharge: 2020-09-29 | Disposition: A | Discharge status: Home / Self Care | Payer: Medicaid Other | Attending: Emergency Medicine | Admitting: Emergency Medicine

## 2020-09-28 DIAGNOSIS — Z8632 Personal history of gestational diabetes: Secondary | ICD-10-CM | POA: Insufficient documentation

## 2020-09-28 DIAGNOSIS — N838 Other noninflammatory disorders of ovary, fallopian tube and broad ligament: Secondary | ICD-10-CM | POA: Diagnosis not present

## 2020-09-28 DIAGNOSIS — R1032 Left lower quadrant pain: Secondary | ICD-10-CM | POA: Insufficient documentation

## 2020-09-28 DIAGNOSIS — R102 Pelvic and perineal pain: Secondary | ICD-10-CM | POA: Insufficient documentation

## 2020-09-28 DIAGNOSIS — R109 Unspecified abdominal pain: Secondary | ICD-10-CM | POA: Diagnosis not present

## 2020-09-28 DIAGNOSIS — R9389 Abnormal findings on diagnostic imaging of other specified body structures: Secondary | ICD-10-CM | POA: Diagnosis not present

## 2020-09-28 DIAGNOSIS — N83201 Unspecified ovarian cyst, right side: Secondary | ICD-10-CM | POA: Diagnosis not present

## 2020-09-28 DIAGNOSIS — K760 Fatty (change of) liver, not elsewhere classified: Secondary | ICD-10-CM | POA: Diagnosis not present

## 2020-09-28 NOTE — ED Triage Notes (Signed)
Pt BIB EMS from home c/o abdominal pain. Sudden onset at 2100. Rebound tenderness RLQ. 100 mcg fentanyl given by EMS. Denies N/V. Reports mass in LLQ that has been there for awhile, but more pronounced today.

## 2020-09-28 NOTE — ED Notes (Signed)
Pt ambulatory to the bathroom to provide UA Pt gait steady

## 2020-09-28 NOTE — ED Provider Notes (Signed)
[facility] DEPT Provider Note   CSN: [id number] Arrival date & time: 09/28/20  2310     History Chief Complaint  Patient presents with   Abdominal Pain    Misty Gamble is a 45 y.o. female.  45 y/o female with hx of gestational DM presents to the ED for evaluation of sudden onset abdominal pain. Symptoms began at 2100 in the LLQ which radiates toward her suprapubic abdomen and RLQ. Feels discomfort at her perineum as well. Symptoms associated with abdominal distension. Pain relieved with Fentanyl given en route by EMS. No N/V, fevers, dysuria, hematuria, vaginal bleeding, bowel changes. Abdominal SHx significant for cholecystectomy.   The history is provided by the patient. No language interpreter was used.  Abdominal Pain      Past Medical History:  Diagnosis Date   Aortic heart murmur on examination 08/19/2012   No previous Echo    Elevated liver enzymes 08/19/2012   Gestational diabetes    Gestational diabetes mellitus 06/02/2015   Versus pre-gestation as early 1-hr 150; 3-hour 97/109/162/130 (per phone contact with HD, actual 3-hour results to be sent)      Patient Active Problem List   Diagnosis Date Noted   Aortic heart murmur on examination 08/19/2012    Past Surgical History:  Procedure Laterality Date   CHOLECYSTECTOMY       OB History    Gravida  12   Para  6   Term  6   Preterm  0   AB  4   Living  6     SAB  4   IAB      Ectopic      Multiple  0   Live Births  6           No family history on file.  Social History   Tobacco Use   Smoking status: Never Smoker   Smokeless tobacco: Never Used  Substance Use Topics   Alcohol use: No   Drug use: No    Home Medications Prior to Admission medications   Medication Sig Start Date End Date Taking? Authorizing Provider  Multiple Vitamin (MULTIVITAMIN WITH MINERALS) TABS tablet Take 1 tablet by mouth daily.   Yes [provider]    Allergies    Ciprofloxacin  Review of Systems   Review of Systems  Gastrointestinal: Positive for abdominal pain.  Ten systems reviewed and are negative for acute change, except as noted in the HPI.    Physical Exam Updated Vital Signs BP 122/73    Pulse 60    Temp 98.1 F (36.7 C) (Oral)    Resp 12    Ht 5\' 1"  (1.549 m)    Wt 69.4 kg    SpO2 100%    BMI 28.91 kg/m   Physical Exam Vitals and nursing note reviewed.  Constitutional:      General: She is not in acute distress.    Appearance: She is well-developed and well-nourished. She is not diaphoretic.     Comments: Nontoxic appearing and in NAD  HENT:     Head: Normocephalic and atraumatic.  Eyes:     General: No scleral icterus.    Extraocular Movements: EOM normal.     Conjunctiva/sclera: Conjunctivae normal.  Cardiovascular:     Rate and Rhythm: Normal rate and regular rhythm.     Pulses: Normal pulses.  Pulmonary:     Effort: Pulmonary effort is normal. No respiratory distress.     Comments:  Respirations even and unlabored Abdominal:     Palpations: Abdomen is soft.     Tenderness: There is abdominal tenderness. There is no rebound.     Comments: TTP in the LLQ and the suprapubic abdomen. Abdomen is soft, obese. No guarding or peritoneal signs. Subcutaneous skin irregularity in the LUQ; no nodular appearance or overlying ecchymosis, erythema  Musculoskeletal:        General: Normal range of motion.     Cervical back: Normal range of motion.  Skin:    General: Skin is warm and dry.     Coloration: Skin is not pale.     Findings: No erythema or rash.  Neurological:     Mental Status: She is alert and oriented to person, place, and time.     Coordination: Coordination normal.  Psychiatric:        Mood and Affect: Mood and affect normal.        Behavior: Behavior normal.     ED Results / Procedures / Treatments   Labs (all labs ordered are listed, but only abnormal results are  displayed) Labs Reviewed  COMPREHENSIVE METABOLIC PANEL - Abnormal; Notable for the following components:      Result Value   Glucose, Bld 154 (*)    All other components within normal limits  URINALYSIS, ROUTINE W REFLEX MICROSCOPIC - Abnormal; Notable for the following components:   Color, Urine STRAW (*)    All other components within normal limits  CBC WITH DIFFERENTIAL/PLATELET  LIPASE, BLOOD  I-STAT BETA HCG BLOOD, ED ([facility], [facility], [facility] ONLY)    EKG None  Radiology CT ABDOMEN PELVIS W CONTRAST  Result Date: 09/29/2020 CLINICAL DATA:  Right lower quadrant abdominal pain EXAM: CT ABDOMEN AND PELVIS WITH CONTRAST TECHNIQUE: Multidetector CT imaging of the abdomen and pelvis was performed using the standard protocol following bolus administration of intravenous contrast. CONTRAST:  175mL OMNIPAQUE IOHEXOL 300 MG/ML  SOLN COMPARISON:  08/18/2012 FINDINGS: Lower chest: The visualized lung bases are clear bilaterally. The visualized heart and pericardium are unremarkable. Hepatobiliary: Status post cholecystectomy. Mild hepatic steatosis. No enhancing intrahepatic mass identified. No intra or extrahepatic biliary ductal dilation. Pancreas: Unremarkable Spleen: Unremarkable Adrenals/Urinary Tract: The adrenal glands are unremarkable. The kidneys are normal. The bladder is unremarkable. Stomach/Bowel: The stomach, small bowel, and large bowel are unremarkable. The appendix is normal. No free intraperitoneal gas. No evidence of obstruction or focal inflammation. 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 Additionally, this is unchanged from prior examination. 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 The abdominal vasculature is otherwise  unremarkable. No pathologic adenopathy within the abdomen and pelvis. Reproductive: There are 2 ring-enhancing structures within the right ovary likely representing corpus lutea. Additionally, a small amount of free fluid is seen within the right adnexa and, together, the findings suggest changes of a ruptured follicle or hemorrhagic corpus luteum. The pelvic organs are otherwise unremarkable. Other: No abdominal wall hernia.  The rectum is unremarkable. Musculoskeletal: No acute bone abnormality. IMPRESSION: Ring-enhancing structures within the right ovary most in keeping with corpus lutea in the premenopausal patient with small free fluid within the right adnexa suggesting follicle rupture or a hemorrhagic corpus luteum. Normal appendix. Mild hepatic steatosis. 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 Electronically Signed   By: Fidela Salisbury MD   On: 09/29/2020 03:05   US PELVIC DOPPLER (TORSION R/O OR MASS ARTERIAL FLOW)  Result Date: 09/29/2020 CLINICAL DATA:  Initial evaluation for acute pelvic pain for 1 day. EXAM: TRANSABDOMINAL AND TRANSVAGINAL ULTRASOUND OF PELVIS DOPPLER ULTRASOUND OF OVARIES TECHNIQUE: Both transabdominal and transvaginal ultrasound examinations of the pelvis were performed. Transabdominal technique was performed for global imaging of the pelvis including uterus, ovaries, adnexal regions, and pelvic cul-de-sac. It was necessary to proceed with endovaginal exam following the transabdominal exam to visualize the uterus, endometrium, and ovaries. Color and duplex Doppler ultrasound was utilized to evaluate blood flow to the ovaries. COMPARISON:  Prior CT from earlier the same day. FINDINGS: Uterus Measurements: 11.1 x 4.8 x 5.6 cm = volume: 155.2 mL. No fibroids or other mass visualized. Endometrium Thickness: 20.3 mm.  No focal abnormality visualized. Right ovary Measurements:  3.3 x 1.9 x 2.6 cm = volume: 8.8 mL. 1.9 x 1.3 x 1.7 cm mildly complex cyst with peripherally increased vascularity, most consistent with a degenerating corpus luteal cyst. Additional probable small corpus luteal cyst measuring up to 1.3 cm noted as well. Findings correspond with lesions seen on prior CT. No other adnexal mass. Left ovary Measurements: 1.8 x 1.4 x 2.0 cm = volume: 2.7 mL. Normal appearance/no adnexal mass. Pulsed Doppler evaluation of both ovaries demonstrates normal low-resistance arterial and venous waveforms. Other findings Trace free fluid within the pelvis, likely physiologic. IMPRESSION: 1. Two mildly complex right ovarian cysts measuring up to 1.9 cm, most consistent with degenerating corpus luteal cysts, corresponding with findings on prior CT. 2. No evidence for ovarian torsion or other acute abnormality. 3. Thickened endometrium measuring up to 20 mm in thickness, considered abnormal. Consider follow-up by [place] in 6-8 weeks, during the week immediately following menses (exam timing is critical). Electronically Signed   By: Jeannine Boga M.D.   On: 09/29/2020 04:00   US PELVIC COMPLETE WITH TRANSVAGINAL  Result Date: 09/29/2020 CLINICAL DATA:  Initial evaluation for acute pelvic pain for 1 day. EXAM: TRANSABDOMINAL AND TRANSVAGINAL ULTRASOUND OF PELVIS DOPPLER ULTRASOUND OF OVARIES TECHNIQUE: Both transabdominal and transvaginal ultrasound examinations of the pelvis were performed. Transabdominal technique was performed for global imaging of the pelvis including uterus, ovaries, adnexal regions, and pelvic cul-de-sac. It was necessary to proceed with endovaginal exam following the transabdominal exam to visualize the uterus, endometrium, and ovaries. Color and duplex Doppler ultrasound was utilized to evaluate blood flow to the ovaries. COMPARISON:  Prior CT from earlier the same day. FINDINGS: Uterus Measurements: 11.1 x 4.8 x 5.6 cm = volume: 155.2 mL. No fibroids or other mass  visualized. Endometrium Thickness: 20.3 mm.  No focal abnormality visualized. Right ovary Measurements: 3.3 x 1.9 x 2.6 cm = volume: 8.8 mL. 1.9 x 1.3 x 1.7 cm mildly complex cyst with peripherally increased vascularity, most consistent with a degenerating corpus luteal cyst. Additional probable small corpus luteal cyst measuring up to 1.3 cm noted as well. Findings correspond with lesions seen on prior CT. No other adnexal mass. Left ovary Measurements: 1.8 x 1.4 x 2.0 cm = volume: 2.7 mL. Normal appearance/no adnexal mass. Pulsed Doppler evaluation of both ovaries demonstrates normal low-resistance arterial and venous waveforms. Other findings Trace free fluid within the pelvis, likely physiologic. IMPRESSION: 1. Two mildly complex right ovarian cysts measuring up to 1.9 cm, most consistent with degenerating corpus luteal cysts, corresponding with findings on prior CT.  2. No evidence for ovarian torsion or other acute abnormality. 3. Thickened endometrium measuring up to 20 mm in thickness, considered abnormal. Consider follow-up by [place] in 6-8 weeks, during the week immediately following menses (exam timing is critical). Electronically Signed   By: Jeannine Boga M.D.   On: 09/29/2020 04:00    Procedures Procedures   Medications Ordered in ED Medications  iohexol (OMNIPAQUE) 300 MG/ML solution 100 mL (100 mLs Intravenous Contrast Given 09/29/20 0243)    ED Course  I have reviewed the triage vital signs and the nursing notes.  Pertinent labs & imaging results that were available during my care of the patient were reviewed by me and considered in my medical decision making (see chart for details).  Clinical Course as of 09/29/20 0447  Wed Sep 29, 2020  0439 Patient resting comfortably. No worsening of pain. Has not required any medications since ED arrival. Communicated results of CT and [place]. Patient verbalizes understanding. Referral given to [facility] for f/u regarding endometrial  thickening. [KH]    Clinical Course User Index [KH] Beverely Pace   MDM Rules/Calculators/A&P                          45 year old female presents to the emergency department for evaluation of abdominal pain. It was sudden in onset around 2100 tonight. She received fentanyl in route by EMS and has not had recurrence of pain since receiving this medication. Noted to have tenderness to palpation in the left lower quadrant and suprapubic abdomen.  Her work-up today is notable for a likely hemorrhagic cyst of the right ovary. Right ovarian cysts confirmed on ultrasound. There is no evidence of torsion. Ultrasound did reveal incidental finding of thickened endometrium. Will refer to OB/GYN for reassessment of this. We will continue with outpatient pain control with the use of NSAIDs; have recommended naproxen.  Return precautions discussed and provided. Patient discharged in stable condition with no unaddressed concerns.   Final Clinical Impression(s) / ED Diagnoses Final diagnoses:  Pelvic pain in female  Ovarian cyst, right  Endometrial thickening on ultrasound    Rx / DC Orders ED Discharge Orders    None       Antonietta Breach, PA-C 28/36/62 [id number]    Delora Fuel, MD 54/65/03 [phone]

## 2020-09-29 ENCOUNTER — Encounter (HOSPITAL_COMMUNITY): Payer: Self-pay

## 2020-09-29 ENCOUNTER — Emergency Department (HOSPITAL_COMMUNITY): Payer: Medicaid Other

## 2020-09-29 DIAGNOSIS — N83201 Unspecified ovarian cyst, right side: Secondary | ICD-10-CM | POA: Diagnosis not present

## 2020-09-29 DIAGNOSIS — K760 Fatty (change of) liver, not elsewhere classified: Secondary | ICD-10-CM | POA: Diagnosis not present

## 2020-09-29 DIAGNOSIS — R109 Unspecified abdominal pain: Secondary | ICD-10-CM | POA: Diagnosis not present

## 2020-09-29 LAB — COMPREHENSIVE METABOLIC PANEL
ALT: 31 U/L (ref 0–44)
AST: 33 U/L (ref 15–41)
Albumin: 4.1 g/dL (ref 3.5–5.0)
Alkaline Phosphatase: 57 U/L (ref 38–126)
Anion gap: 9 (ref 5–15)
BUN: 15 mg/dL (ref 6–20)
CO2: 25 mmol/L (ref 22–32)
Calcium: 9.3 mg/dL (ref 8.9–10.3)
Chloride: 104 mmol/L (ref 98–111)
Creatinine, Ser: 0.66 mg/dL (ref 0.44–1.00)
GFR, Estimated: >60 mL/min (ref >60)
Glucose, Bld: 154 mg/dL — ABNORMAL HIGH (ref 70–99)
Potassium: 3.9 mmol/L (ref 3.5–5.1)
Sodium: 138 mmol/L (ref 135–145)
Total Bilirubin: 0.5 mg/dL (ref 0.3–1.2)
Total Protein: 7.1 g/dL (ref 6.5–8.1)

## 2020-09-29 LAB — URINALYSIS, ROUTINE W REFLEX MICROSCOPIC
Bilirubin Urine: NEGATIVE
Glucose, UA: NEGATIVE mg/dL
Hgb urine dipstick: NEGATIVE
Ketones, ur: NEGATIVE mg/dL
Leukocytes,Ua: NEGATIVE
Nitrite: NEGATIVE
Protein, ur: NEGATIVE mg/dL
Specific Gravity, Urine: 1.01 (ref 1.005–1.030)
pH: 7 (ref 5.0–8.0)

## 2020-09-29 LAB — CBC WITH DIFFERENTIAL/PLATELET
Abs Immature Granulocytes: 0.01 10*3/uL (ref 0.00–0.07)
Basophils Absolute: 0 10*3/uL (ref 0.0–0.1)
Basophils Relative: 1 %
Eosinophils Absolute: 0.3 10*3/uL (ref 0.0–0.5)
Eosinophils Relative: 5 %
HCT: 38.2 % (ref 36.0–46.0)
Hemoglobin: 13.1 g/dL (ref 12.0–15.0)
Immature Granulocytes: 0 %
Lymphocytes Relative: 35 %
Lymphs Abs: 1.9 10*3/uL (ref 0.7–4.0)
MCH: 31.3 pg (ref 26.0–34.0)
MCHC: 34.3 g/dL (ref 30.0–36.0)
MCV: 91.4 fL (ref 80.0–100.0)
Monocytes Absolute: 0.4 10*3/uL (ref 0.1–1.0)
Monocytes Relative: 7 %
Neutro Abs: 2.9 10*3/uL (ref 1.7–7.7)
Neutrophils Relative %: 52 %
Platelets: 205 10*3/uL (ref 150–400)
RBC: 4.18 MIL/uL (ref 3.87–5.11)
RDW: 13.3 % (ref 11.5–15.5)
WBC: 5.6 10*3/uL (ref 4.0–10.5)
nRBC: 0 % (ref 0.0–0.2)

## 2020-09-29 LAB — I-STAT BETA HCG BLOOD, ED (MC, WL, AP ONLY): I-stat hCG, quantitative: <5 m[IU]/mL (ref <5)

## 2020-09-29 LAB — LIPASE, BLOOD: Lipase: 32 U/L (ref 11–51)

## 2020-09-29 MED ORDER — IOHEXOL 300 MG/ML  SOLN
100.0000 mL | Freq: Once | INTRAMUSCULAR | Status: AC | PRN
  Administered 2020-09-29: 100 mL via INTRAVENOUS

## 2020-09-29 NOTE — Discharge Instructions (Addendum)
Your work-up today was notable for 2 cysts on your right ovary.  This is likely responsible for your abdominal pain.  The remainder of your blood work and imaging was reassuring.  We recommend Aleve for management of any ongoing discomfort.  You may also try topical heating pads.  Your endometrium (lining of your uterus) was thickened to 75mm which is abnormal and should be rechecked by your OBGYN. This is not responsible for your abdominal pain today. You will likely benefit from a repeat ultrasound in 6-8 weeks after a menstrual cycle. Your doctor may decide to complete a biopsy, if they feel this is necessary.

## 2020-10-01 ENCOUNTER — Telehealth: Payer: Self-pay

## 2020-10-01 NOTE — Telephone Encounter (Signed)
Transition Care Management Follow-up Telephone Call  Date of discharge and from where: 09/29/2020 [facility]  How have you been since you were released from the hospital? Feeling a lot better, was able to get out the house today and run errands. Has not needs to take any tylenol.   Any questions or concerns? No  Items Reviewed:  Did the pt receive and understand the discharge instructions provided? Yes   Medications obtained and verified? No medications ordered.   Other? No   Any new allergies since your discharge? No   Dietary orders reviewed? Yes  Do you have support at home? Yes    Functional Questionnaire: (I = Independent and D = Dependent) ADLs: I  Bathing/Dressing- I  Meal Prep- I  Eating- I  Maintaining continence- I  Transferring/Ambulation- I  Managing Meds- I  Follow up appointments reviewed:   PCP Hospital f/u appt confirmed? Yes  Scheduled to see Dionisio David, NP on 10/19/2019 @ [facility] f/u appt confirmed? Yes  Scheduled to see Drake Leach on 10/14/2020 @ 2:35pm.  Are transportation arrangements needed? No   If their condition worsens, is the pt aware to call PCP or go to the Emergency Dept.? Yes  Was the patient provided with contact information for the PCP's office or ED? Yes  Was to pt encouraged to call back with questions or concerns? Yes

## 2020-10-14 ENCOUNTER — Encounter: Payer: Self-pay | Admitting: Obstetrics & Gynecology

## 2020-10-14 ENCOUNTER — Other Ambulatory Visit: Payer: Self-pay

## 2020-10-14 ENCOUNTER — Ambulatory Visit (INDEPENDENT_AMBULATORY_CARE_PROVIDER_SITE_OTHER): Payer: Medicaid Other | Admitting: Obstetrics & Gynecology

## 2020-10-14 ENCOUNTER — Other Ambulatory Visit (HOSPITAL_COMMUNITY)
Admission: RE | Admit: 2020-10-14 | Discharge: 2020-10-14 | Disposition: A | Discharge status: Home / Self Care | Payer: Medicaid Other | Source: Ambulatory Visit | Attending: Obstetrics & Gynecology | Admitting: Obstetrics & Gynecology

## 2020-10-14 VITALS — BP 133/84 | HR 83 | Ht 61.0 in | Wt 160.0 lb

## 2020-10-14 DIAGNOSIS — N926 Irregular menstruation, unspecified: Secondary | ICD-10-CM

## 2020-10-14 DIAGNOSIS — N951 Menopausal and female climacteric states: Secondary | ICD-10-CM | POA: Diagnosis not present

## 2020-10-14 DIAGNOSIS — Z01419 Encounter for gynecological examination (general) (routine) without abnormal findings: Secondary | ICD-10-CM

## 2020-10-14 MED ORDER — DROSPIRENONE-ETHINYL ESTRADIOL 3-0.02 MG PO TABS: 1.0000 | ORAL_TABLET | Freq: Every day | ORAL | 11 refills | Status: DC

## 2020-10-14 NOTE — Progress Notes (Signed)
Patient ID: Misty Gamble, female   DOB: 17-Jul-1976, 45 y.o.   MRN: [id number]  Chief Complaint  Patient presents with  . Follow-up    HPI Misty Gamble is a 45 y.o. female.  [id number]  Patient's last menstrual period was 10/10/2020 (exact date). Patient has irregular menses sometimes heavy with cramps. She does not plan to conceive, and has used OCP in the past but not BCM now. Youngest child is 5.  HPI  Past Medical History:  Diagnosis Date  . Aortic heart murmur on examination 08/19/2012   No previous Echo   . Elevated liver enzymes 08/19/2012  . Gestational diabetes   . Gestational diabetes mellitus 06/02/2015   Versus pre-gestation as early 1-hr 150; 3-hour 97/109/162/130 (per phone contact with HD, actual 3-hour results to be sent)      Past Surgical History:  Procedure Laterality Date  . CHOLECYSTECTOMY      Family History  Problem Relation Age of Onset  . Healthy Mother   . Healthy Father     Social History Social History   Tobacco Use  . Smoking status: Never Smoker  . Smokeless tobacco: Never Used  Vaping Use  . Vaping Use: Never used  Substance Use Topics  . Alcohol use: No  . Drug use: No    Allergies  Allergen Reactions  . Ciprofloxacin Rash    Current Outpatient Medications  Medication Sig Dispense Refill  . drospirenone-ethinyl estradiol (YAZ) 3-0.02 MG tablet Take 1 tablet by mouth daily. 28 tablet 11  . Multiple Vitamin (MULTIVITAMIN WITH MINERALS) TABS tablet Take 1 tablet by mouth daily.     No current facility-administered medications for this visit.    Review of Systems Review of Systems  Respiratory: Negative.   Gastrointestinal: Negative.   Endocrine:       VMS  Genitourinary: Positive for menstrual problem, pelvic pain (cramps) and vaginal bleeding. Negative for dyspareunia and vaginal discharge.    Blood pressure 133/84, pulse 83, height 5\' 1"  (1.549 m), weight 160 lb (72.6 kg), last menstrual period  10/10/2020, unknown if currently breastfeeding.  Physical Exam Physical Exam Vitals and nursing note reviewed. Exam conducted with a chaperone present.  Constitutional:      Appearance: Normal appearance.  Cardiovascular:     Rate and Rhythm: Normal rate.  Pulmonary:     Effort: Pulmonary effort is normal.  Abdominal:     General: Abdomen is flat. There is no distension.     Palpations: There is no mass.     Tenderness: There is no abdominal tenderness.  Genitourinary:    General: Normal vulva.     Exam position: Lithotomy position.     Vagina: Normal.     Cervix: Normal. Discharge: blood only.     Uterus: Normal. Not enlarged and not tender.      Adnexa: Right adnexa normal and left adnexa normal.     Comments: Pap done Neurological:     Mental Status: She is alert.     Data Reviewed Narrative & Impression  CLINICAL DATA:  Initial evaluation for acute pelvic pain for 1 day.  EXAM: TRANSABDOMINAL AND TRANSVAGINAL ULTRASOUND OF PELVIS  DOPPLER ULTRASOUND OF OVARIES  TECHNIQUE: Both transabdominal and transvaginal ultrasound examinations of the pelvis were performed. Transabdominal technique was performed for global imaging of the pelvis including uterus, ovaries, adnexal regions, and pelvic cul-de-sac.  It was necessary to proceed with endovaginal exam following the transabdominal exam to visualize the uterus, endometrium, and ovaries. Color and  duplex Doppler ultrasound was utilized to evaluate blood flow to the ovaries.  COMPARISON:  Prior CT from earlier the same day.  FINDINGS: Uterus  Measurements: 11.1 x 4.8 x 5.6 cm = volume: 155.2 mL. No fibroids or other mass visualized.  Endometrium  Thickness: 20.3 mm.  No focal abnormality visualized.  Right ovary  Measurements: 3.3 x 1.9 x 2.6 cm = volume: 8.8 mL. 1.9 x 1.3 x 1.7 cm mildly complex cyst with peripherally increased vascularity, most consistent with a degenerating corpus luteal cyst.  Additional probable small corpus luteal cyst measuring up to 1.3 cm noted as well. Findings correspond with lesions seen on prior CT. No other adnexal mass.  Left ovary  Measurements: 1.8 x 1.4 x 2.0 cm = volume: 2.7 mL. Normal appearance/no adnexal mass.  Pulsed Doppler evaluation of both ovaries demonstrates normal low-resistance arterial and venous waveforms.  Other findings  Trace free fluid within the pelvis, likely physiologic.  IMPRESSION: 1. Two mildly complex right ovarian cysts measuring up to 1.9 cm, most consistent with degenerating corpus luteal cysts, corresponding with findings on prior CT. 2. No evidence for ovarian torsion or other acute abnormality. 3. Thickened endometrium measuring up to 20 mm in thickness, considered abnormal. Consider follow-up by [place] in 6-8 weeks, during the week immediately following menses (exam timing is critical).   Electronically Signed   By: Jeannine Boga M.D.   On: 09/29/2020 04:00     Assessment Perimenopausal DUB  Plan She would like cycle control and will try Yaz for this purpose. She will RTC in 4-6 months and if indicated will consider repeat US    [name] 10/14/2020, 3:27 PM

## 2020-10-14 NOTE — Patient Instructions (Signed)
[employer] Endocrinology (14th ed., pp. [phone]). [place], [place]: Elsevier.">  Perimenopause Perimenopause is the normal time of a woman's life when the levels of estrogen, the female hormone produced by the ovaries, begin to decrease. This leads to changes in menstrual periods before they stop completely (menopause). Perimenopause can begin 2-8 years before menopause. During perimenopause, the ovaries may or may not produce an egg and a woman can still become pregnant. What are the causes? This condition is caused by a natural change in hormone levels that happens as you get older. What increases the risk? This condition is more likely to start at an earlier age if you have certain medical conditions or have undergone treatments, including:  A tumor of the pituitary gland in the brain.  A disease that affects the ovaries and hormone production.  Certain cancer treatments, such as chemotherapy or hormone therapy, or radiation therapy on the pelvis.  Heavy smoking and excessive alcohol use.  Family history of early menopause. What are the signs or symptoms? Perimenopausal changes affect each woman differently. Symptoms of this condition may include:  Hot flashes.  Irregular menstrual periods.  Night sweats.  Changes in feelings about sex. This could be a decrease in sex drive or an increased discomfort around your sexuality.  Vaginal dryness.  Headaches.  Mood swings.  Depression.  Problems sleeping (insomnia).  Memory problems or trouble concentrating.  Irritability.  Tiredness.  Weight gain.  Anxiety.  Trouble getting pregnant. How is this diagnosed? This condition is diagnosed based on your medical history, a physical exam, your age, your menstrual history, and your symptoms. Hormone tests may also be done. How is this treated? In some cases, no treatment is needed. You and your health care provider should make a decision together about whether  treatment is necessary. Treatment will be based on your individual condition and preferences. Various treatments are available, such as:  Menopausal hormone therapy (MHT).  Medicines to treat specific symptoms.  Acupuncture.  Vitamin or herbal supplements. Before starting treatment, make sure to let your health care provider know if you have a personal or family history of:  Heart disease.  Breast cancer.  Blood clots.  Diabetes.  Osteoporosis. Follow these instructions at home: Medicines  Take over-the-counter and prescription medicines only as told by your health care provider.  Take vitamin supplements only as told by your health care provider.  Talk with your health care provider before starting any herbal supplements. Lifestyle  Do not use any products that contain nicotine or tobacco, such as cigarettes, e-cigarettes, and chewing tobacco. If you need help quitting, ask your health care provider.  Get at least 30 minutes of physical activity on 5 or more days each week.  Eat a balanced diet that includes fresh fruits and vegetables, whole grains, soybeans, eggs, lean meat, and low-fat dairy.  Avoid alcoholic and caffeinated beverages, as well as spicy foods. This may help prevent hot flashes.  Get 7-8 hours of sleep each night.  Dress in layers that can be removed to help you manage hot flashes.  Find ways to manage stress, such as deep breathing, meditation, or journaling.   General instructions  Keep track of your menstrual periods, including: ? When they occur. ? How heavy they are and how long they last. ? How much time passes between periods.  Keep track of your symptoms, noting when they start, how often you have them, and how long they last.  Use vaginal lubricants or moisturizers to help  with vaginal dryness and improve comfort during sex.  You can still become pregnant if you are having irregular periods. Make sure you use contraception during  perimenopause if you do not want to get pregnant.  Keep all follow-up visits. This is important. This includes any group therapy or counseling.   Contact a health care provider if:  You have heavy vaginal bleeding or pass blood clots.  Your period lasts more than 2 days longer than normal.  Your periods are recurring sooner than 21 days.  You bleed after having sex.  You have pain during sex. Get help right away if you have:  Chest pain, trouble breathing, or trouble talking.  Severe depression.  Pain when you urinate.  Severe headaches.  Vision problems. Summary  Perimenopause is the time when a woman's body begins to move into menopause. This may happen naturally or as a result of other health problems or medical treatments.  Perimenopause can begin 2-8 years before menopause, and it can last for several years.  Perimenopausal symptoms can be managed through medicines, lifestyle changes, and complementary therapies such as acupuncture. This information is not intended to replace advice given to you by your health care provider. Make sure you discuss any questions you have with your health care provider. Document Revised: 02/05/2020 Document Reviewed: 02/05/2020 Elsevier Patient Education  [place].

## 2020-10-15 LAB — FOLLICLE STIMULATING HORMONE: FSH: 17.4 m[IU]/mL

## 2020-10-15 LAB — CBC
Hematocrit: 38.4 % (ref 34.0–46.6)
Hemoglobin: 13 g/dL (ref 11.1–15.9)
MCH: 30.7 pg (ref 26.6–33.0)
MCHC: 33.9 g/dL (ref 31.5–35.7)
MCV: 91 fL (ref 79–97)
Platelets: 260 10*3/uL (ref 150–450)
RBC: 4.24 x10E6/uL (ref 3.77–5.28)
RDW: 12.8 % (ref 11.7–15.4)
WBC: 6 10*3/uL (ref 3.4–10.8)

## 2020-10-15 LAB — CYTOLOGY - PAP
Chlamydia: NEGATIVE
Comment: NEGATIVE
Comment: NEGATIVE
Comment: NEGATIVE
Comment: NORMAL
Diagnosis: NEGATIVE
High risk HPV: NEGATIVE
Neisseria Gonorrhea: NEGATIVE
Trichomonas: NEGATIVE

## 2020-10-18 ENCOUNTER — Other Ambulatory Visit: Payer: Self-pay

## 2020-10-18 ENCOUNTER — Ambulatory Visit (INDEPENDENT_AMBULATORY_CARE_PROVIDER_SITE_OTHER): Payer: Medicaid Other | Admitting: Nurse Practitioner

## 2020-10-18 ENCOUNTER — Encounter: Payer: Self-pay | Admitting: Nurse Practitioner

## 2020-10-18 VITALS — BP 137/82 | HR 72 | Temp 97.5°F | Ht 60.5 in | Wt 160.0 lb

## 2020-10-18 DIAGNOSIS — Z131 Encounter for screening for diabetes mellitus: Secondary | ICD-10-CM

## 2020-10-18 DIAGNOSIS — Z7689 Persons encountering health services in other specified circumstances: Secondary | ICD-10-CM | POA: Diagnosis not present

## 2020-10-18 DIAGNOSIS — Z1231 Encounter for screening mammogram for malignant neoplasm of breast: Secondary | ICD-10-CM | POA: Diagnosis not present

## 2020-10-18 DIAGNOSIS — R1902 Left upper quadrant abdominal swelling, mass and lump: Secondary | ICD-10-CM | POA: Diagnosis not present

## 2020-10-18 LAB — POCT URINALYSIS DIP (CLINITEK)
Bilirubin, UA: NEGATIVE
Blood, UA: NEGATIVE
Glucose, UA: NEGATIVE mg/dL
Ketones, POC UA: NEGATIVE mg/dL
Leukocytes, UA: NEGATIVE
Nitrite, UA: NEGATIVE
POC PROTEIN,UA: NEGATIVE
Spec Grav, UA: >=1.03 — AB (ref 1.010–1.025)
Urobilinogen, UA: 0.2 E.U./dL
pH, UA: 6.5 (ref 5.0–8.0)

## 2020-10-18 LAB — GLUCOSE, POCT (MANUAL RESULT ENTRY): POC Glucose: 122 mg/dl — AB (ref 70–99)

## 2020-10-18 NOTE — Progress Notes (Signed)
Hospital follow up - cyst on ovary  CBG- 122

## 2020-10-18 NOTE — Progress Notes (Signed)
New Patient Office Visit  Subjective:  Patient ID: Misty Gamble, female    DOB: 1975-09-27  Age: 45 y.o. MRN: [id number]  CC:  Chief Complaint  Patient presents with  . New Patient (Initial Visit)         HPI Misty Gamble presents to establish care. She  has a past medical history of Aortic heart murmur on examination (08/19/2012), Elevated liver enzymes (08/19/2012), Gestational diabetes, and Gestational diabetes mellitus (06/02/2015).   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 She finds this very rewarding. She was recently see by ED for an ovarian cyst and she followed up with GYN. She is doing well now.    Past Medical History:  Diagnosis Date  . Aortic heart murmur on examination 08/19/2012   No previous Echo   . Elevated liver enzymes 08/19/2012  . Gestational diabetes   . Gestational diabetes mellitus 06/02/2015   Versus pre-gestation as early 1-hr 150; 3-hour 97/109/162/130 (per phone contact with HD, actual 3-hour results to be sent)      Past Surgical History:  Procedure Laterality Date  . CHOLECYSTECTOMY      Family History  Problem Relation Age of Onset  . Healthy Mother   . Healthy Father     Social History   Socioeconomic History  . Marital status: Married    Spouse name: Not on file  . Number of children: Not on file  . Years of education: Not on file  . Highest education level: Not on file  Occupational History  . Not on file  Tobacco Use  . Smoking status: Never Smoker  . Smokeless tobacco: Never Used  Vaping Use  . Vaping Use: Never used  Substance and Sexual Activity  . Alcohol use: No  . Drug use: No  . Sexual activity: Not Currently    Birth control/protection: None  Other Topics Concern  . Not on file  Social History Narrative  . Not on file   Social Determinants of Health   Financial Resource Strain: Not on file  Food Insecurity: Not on file  Transportation Needs: Not on file  Physical Activity: Not  on file  Stress: Not on file  Social Connections: Not on file  Intimate Partner Violence: Not on file    ROS Review of Systems  Constitutional: Negative.   HENT: Negative.   Eyes: Negative.   Respiratory: Negative.   Cardiovascular: Negative.   Gastrointestinal: Negative.   Endocrine: Negative.   Genitourinary: Negative.   Musculoskeletal: Negative.   Skin: Negative.   Allergic/Immunologic: Negative.   Neurological: Negative.   Hematological: Negative.   Psychiatric/Behavioral: Negative.     Objective:   Today's Vitals: BP 137/82   Pulse 72   Temp (!) 97.5 F (36.4 C)   Ht 5' 0.5" (1.537 m)   Wt 160 lb (72.6 kg)   LMP 10/10/2020 (Exact Date)   BMI 30.73 kg/m   Physical Exam Constitutional:      Appearance: She is obese.  HENT:     Head: Normocephalic and atraumatic.     Right Ear: Tympanic membrane normal.     Left Ear: Tympanic membrane normal.     Nose: Nose normal.     Mouth/Throat:     Mouth: Mucous membranes are dry.  Cardiovascular:     Rate and Rhythm: Normal rate and regular rhythm.     Pulses: Normal pulses.     Heart sounds: Normal heart sounds.  Pulmonary:  Effort: Pulmonary effort is normal.     Breath sounds: Normal breath sounds.  Abdominal:     General: Bowel sounds are normal.     Palpations: Abdomen is soft. There is mass (left lower quad).  Musculoskeletal:     Cervical back: Normal range of motion.  Skin:    General: Skin is warm and dry.     Capillary Refill: Capillary refill takes less than 2 seconds.  Neurological:     General: No focal deficit present.     Mental Status: She is alert and oriented to person, place, and time.  Psychiatric:        Mood and Affect: Mood normal.        Behavior: Behavior normal.        Thought Content: Thought content normal.        Judgment: Judgment normal.     Assessment & Plan:   Problem List Items Addressed This Visit   None   Visit Diagnoses    Encounter to establish care    -   Primary Discussed female health maintenance; SBE, annual CBE, PAP test Discussed general safety in vehicle and COVID Discussed regular hydration with water Discussed healthy diet and exercise and weight management Discussed sexual health  Discussed mental health Encouraged to call our office for an appointment with in ongoing concerns for questions.     Left upper quadrant abdominal mass     Persistent and enlarging abdominal waistline [place] pending    Relevant Orders   US Abdomen Limited   Screening for diabetes mellitus (DM)       Relevant Orders   Glucose (CBG)   POCT URINALYSIS DIP (CLINITEK) (Completed)   Hemoglobin A1c   Microalbumin, urine   Screening mammogram, encounter for       Relevant Orders   MM DIGITAL SCREENING BILATERAL      Outpatient Encounter Medications as of 10/18/2020  Medication Sig  . drospirenone-ethinyl estradiol (YAZ) 3-0.02 MG tablet Take 1 tablet by mouth daily.  . Multiple Vitamin (MULTIVITAMIN WITH MINERALS) TABS tablet Take 1 tablet by mouth daily.   No facility-administered encounter medications on file as of 10/18/2020.    Follow-up: Return in about 3 months (around 01/15/2021).   Vevelyn Francois, NP

## 2020-10-18 NOTE — Patient Instructions (Signed)
Health Maintenance, Female Adopting a healthy lifestyle and getting preventive care are important in promoting health and wellness. Ask your health care provider about:  The right schedule for you to have regular tests and exams.  Things you can do on your own to prevent diseases and keep yourself healthy. What should I know about diet, weight, and exercise? Eat a healthy diet  Eat a diet that includes plenty of vegetables, fruits, low-fat dairy products, and lean protein.  Do not eat a lot of foods that are high in solid fats, added sugars, or sodium.   Maintain a healthy weight Body mass index (BMI) is used to identify weight problems. It estimates body fat based on height and weight. Your health care provider can help determine your BMI and help you achieve or maintain a healthy weight. Get regular exercise Get regular exercise. This is one of the most important things you can do for your health. Most adults should:  Exercise for at least 150 minutes each week. The exercise should increase your heart rate and make you sweat (moderate-intensity exercise).  Do strengthening exercises at least twice a week. This is in addition to the moderate-intensity exercise.  Spend less time sitting. Even light physical activity can be beneficial. Watch cholesterol and blood lipids Have your blood tested for lipids and cholesterol at 47 years of age, then have this test every 5 years. Have your cholesterol levels checked more often if:  Your lipid or cholesterol levels are high.  You are older than 47 years of age.  You are at high risk for heart disease. What should I know about cancer screening? Depending on your health history and family history, you may need to have cancer screening at various ages. This may include screening for:  Breast cancer.  Cervical cancer.  Colorectal cancer.  Skin cancer.  Lung cancer. What should I know about heart disease, diabetes, and high blood  pressure? Blood pressure and heart disease  High blood pressure causes heart disease and increases the risk of stroke. This is more likely to develop in people who have high blood pressure readings, are of African descent, or are overweight.  Have your blood pressure checked: ? Every 3-5 years if you are 18-39 years of age. ? Every year if you are 40 years old or older. Diabetes Have regular diabetes screenings. This checks your fasting blood sugar level. Have the screening done:  Once every three years after age 40 if you are at a normal weight and have a low risk for diabetes.  More often and at a younger age if you are overweight or have a high risk for diabetes. What should I know about preventing infection? Hepatitis B If you have a higher risk for hepatitis B, you should be screened for this virus. Talk with your health care provider to find out if you are at risk for hepatitis B infection. Hepatitis C Testing is recommended for:  Everyone born from 1945 through 1965.  Anyone with known risk factors for hepatitis C. Sexually transmitted infections (STIs)  Get screened for STIs, including gonorrhea and chlamydia, if: ? You are sexually active and are younger than 47 years of age. ? You are older than 47 years of age and your health care provider tells you that you are at risk for this type of infection. ? Your sexual activity has changed since you were last screened, and you are at increased risk for chlamydia or gonorrhea. Ask your health care provider   if you are at risk.  Ask your health care provider about whether you are at high risk for HIV. Your health care provider may recommend a prescription medicine to help prevent HIV infection. If you choose to take medicine to prevent HIV, you should first get tested for HIV. You should then be tested every 3 months for as long as you are taking the medicine. Pregnancy  If you are about to stop having your period (premenopausal) and  you may become pregnant, seek counseling before you get pregnant.  Take 400 to 800 micrograms (mcg) of folic acid every day if you become pregnant.  Ask for birth control (contraception) if you want to prevent pregnancy. Osteoporosis and menopause Osteoporosis is a disease in which the bones lose minerals and strength with aging. This can result in bone fractures. If you are 65 years old or older, or if you are at risk for osteoporosis and fractures, ask your health care provider if you should:  Be screened for bone loss.  Take a calcium or vitamin D supplement to lower your risk of fractures.  Be given hormone replacement therapy (HRT) to treat symptoms of menopause. Follow these instructions at home: Lifestyle  Do not use any products that contain nicotine or tobacco, such as cigarettes, e-cigarettes, and chewing tobacco. If you need help quitting, ask your health care provider.  Do not use street drugs.  Do not share needles.  Ask your health care provider for help if you need support or information about quitting drugs. Alcohol use  Do not drink alcohol if: ? Your health care provider tells you not to drink. ? You are pregnant, may be pregnant, or are planning to become pregnant.  If you drink alcohol: ? Limit how much you use to 0-1 drink a day. ? Limit intake if you are breastfeeding.  Be aware of how much alcohol is in your drink. In the [place] one drink equals one 12 oz bottle of beer (355 mL), one 5 oz glass of wine (148 mL), or one 1 oz glass of hard liquor (44 mL). General instructions  Schedule regular health, dental, and eye exams.  Stay current with your vaccines.  Tell your health care provider if: ? You often feel depressed. ? You have ever been abused or do not feel safe at home. Summary  Adopting a healthy lifestyle and getting preventive care are important in promoting health and wellness.  Follow your health care provider's instructions about healthy  diet, exercising, and getting tested or screened for diseases.  Follow your health care provider's instructions on monitoring your cholesterol and blood pressure. This information is not intended to replace advice given to you by your health care provider. Make sure you discuss any questions you have with your health care provider. Document Revised: 08/14/2018 Document Reviewed: 08/14/2018 Elsevier Patient Education  2021 [employer]vier Inc.  

## 2020-10-19 ENCOUNTER — Telehealth: Payer: Self-pay

## 2020-10-19 LAB — HEMOGLOBIN A1C
Est. average glucose Bld gHb Est-mCnc: 117 mg/dL
Hgb A1c MFr Bld: 5.7 % — ABNORMAL HIGH (ref 4.8–5.6)

## 2020-10-19 LAB — MICROALBUMIN, URINE: Microalbumin, Urine: <3 ug/mL

## 2020-10-19 NOTE — Telephone Encounter (Addendum)
-----   Message from Woodroe Mode, MD sent at 10/18/2020  8:42 AM EST ----- Hormone level is elevated consistent with approaching menopause but not there yet   Called pt with interpreter Raquel; VM left stating we are calling with results and you may call back for more information. Callback number given. Pt will be called a second time.

## 2020-10-20 NOTE — Telephone Encounter (Signed)
Called pt with interpreter Misty Gamble, results given. Also reviewed PAP result.

## 2020-10-21 ENCOUNTER — Telehealth: Payer: Self-pay

## 2020-10-21 NOTE — Telephone Encounter (Signed)
Attempted to reach patient with interpreter services.  Left message for patient to call back to discuss results.

## 2020-10-21 NOTE — Telephone Encounter (Signed)
Left message using interpreter services for patient to call office to schedule ultrasound.

## 2020-10-21 NOTE — Telephone Encounter (Signed)
-----   Message from Vevelyn Francois, NP sent at 10/20/2020  9:23 AM EST ----- Please make patient aware that her A1c did indicate slight elevation;prediabetes. Monitor her diet and increase her exercise is effective for prevention of diabetes. We will reevaluate this level in 6 months. Thanks  Patient has a history of gestational diabetes. She is aware of dietary changes that are effective. This was a screeing and our supplies were out. Thanks;

## 2021-01-12 ENCOUNTER — Ambulatory Visit: Payer: Medicaid Other | Admitting: Nurse Practitioner

## 2021-09-04 DIAGNOSIS — J019 Acute sinusitis, unspecified: Secondary | ICD-10-CM | POA: Diagnosis not present

## 2021-09-04 DIAGNOSIS — M940 Chondrocostal junction syndrome [Tietze]: Secondary | ICD-10-CM | POA: Diagnosis not present

## 2021-11-14 DIAGNOSIS — F439 Reaction to severe stress, unspecified: Secondary | ICD-10-CM | POA: Diagnosis not present

## 2022-05-08 DIAGNOSIS — D171 Benign lipomatous neoplasm of skin and subcutaneous tissue of trunk: Secondary | ICD-10-CM | POA: Diagnosis not present

## 2022-05-11 DIAGNOSIS — H5213 Myopia, bilateral: Secondary | ICD-10-CM | POA: Diagnosis not present

## 2022-05-17 DIAGNOSIS — R1900 Intra-abdominal and pelvic swelling, mass and lump, unspecified site: Secondary | ICD-10-CM | POA: Diagnosis not present

## 2022-06-01 IMAGING — US US PELVIS COMPLETE WITH TRANSVAGINAL
1 series · 13 of 25 positions shown · non-contrast
Comparison: Prior CT from earlier the same day.

CLINICAL DATA: Initial evaluation for acute pelvic pain for 1 day.

EXAM:
TRANSABDOMINAL AND TRANSVAGINAL ULTRASOUND OF PELVIS
DOPPLER ULTRASOUND OF OVARIES
TECHNIQUE: Both transabdominal and transvaginal ultrasound examinations of the
pelvis were performed. Transabdominal technique was performed for
global imaging of the pelvis including uterus, ovaries, adnexal
regions, and pelvic cul-de-sac.
It was necessary to proceed with endovaginal exam following the
transabdominal exam to visualize the uterus, endometrium, and
ovaries. Color and duplex Doppler ultrasound was utilized to
evaluate blood flow to the ovaries.

[Series 1: us pelvis complete with transvaginal · 67 acquisitions, 13 frames shown]
[im 1/67]
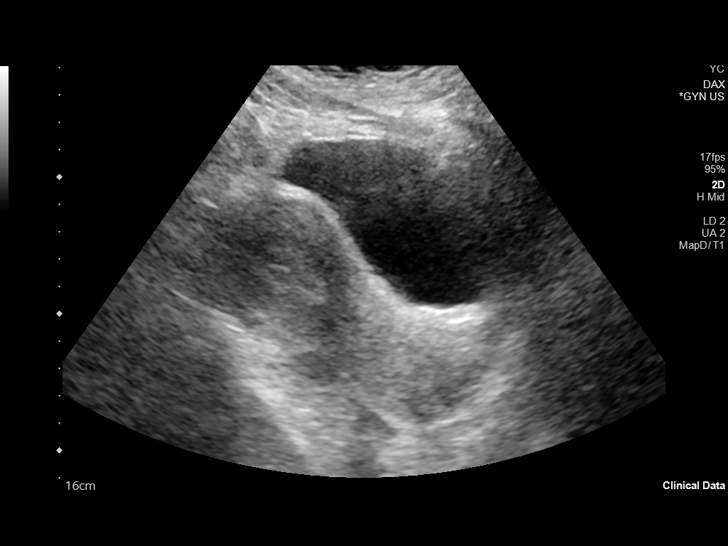
[im 6/67]
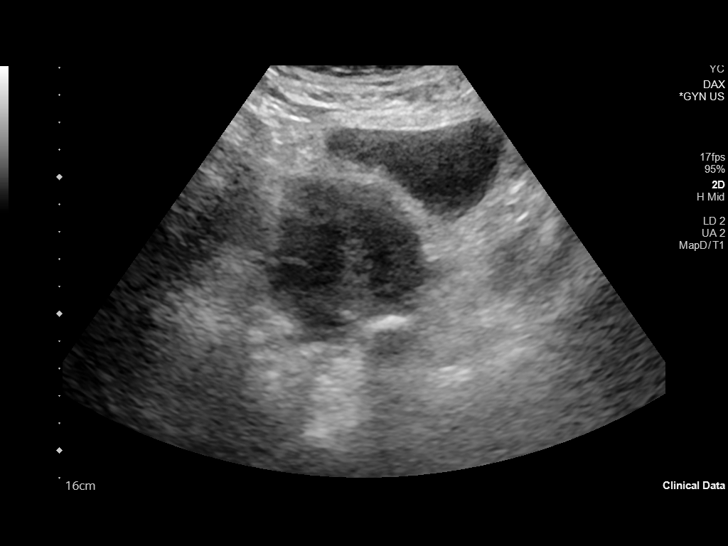
[im 12/67]
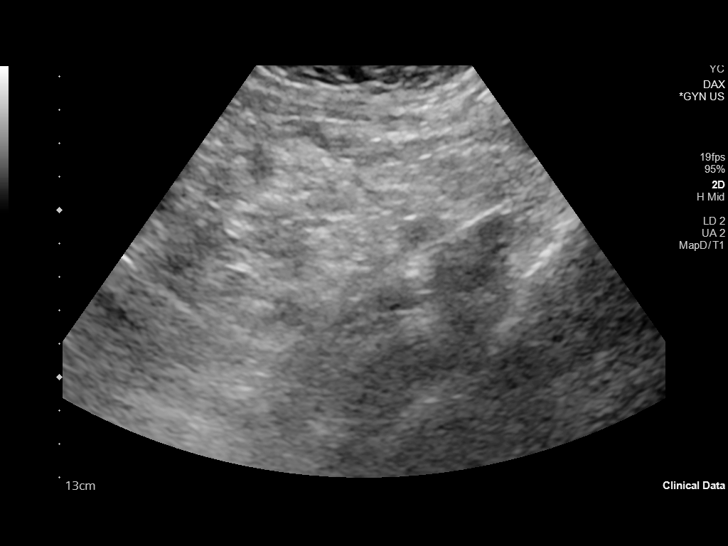
[im 17/67]
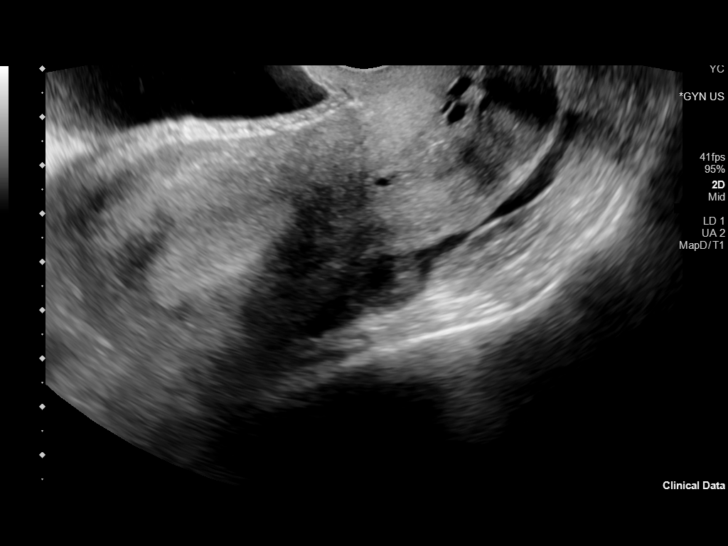
[im 23/67]
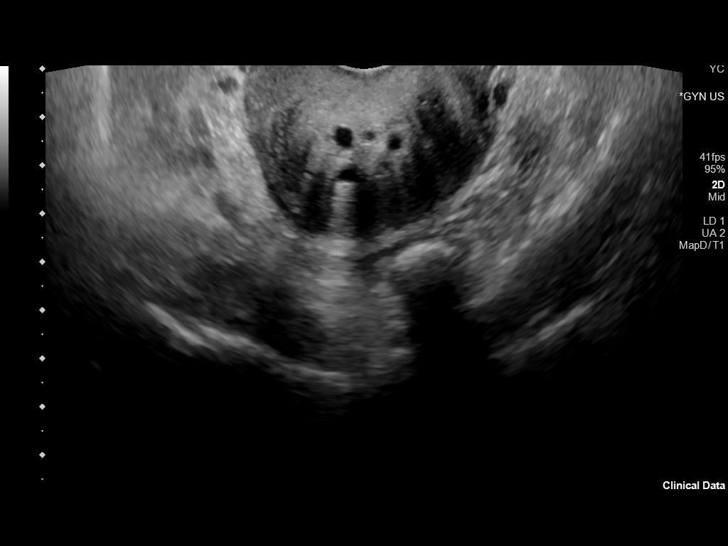
[im 28/67]
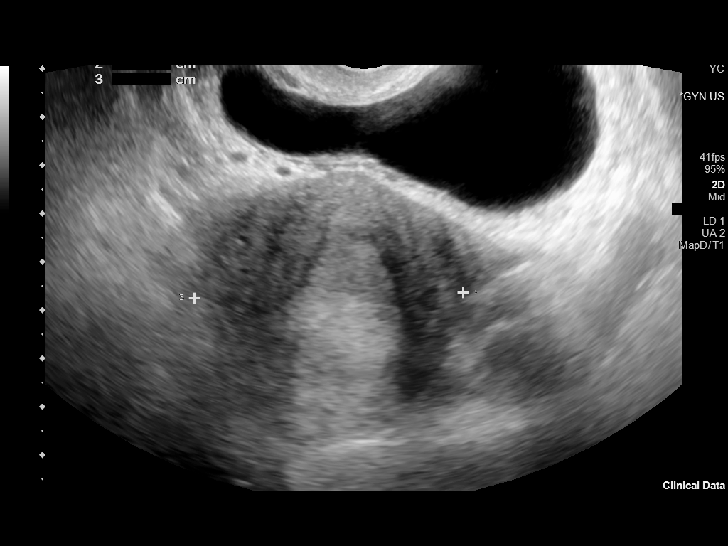
[im 34/67]
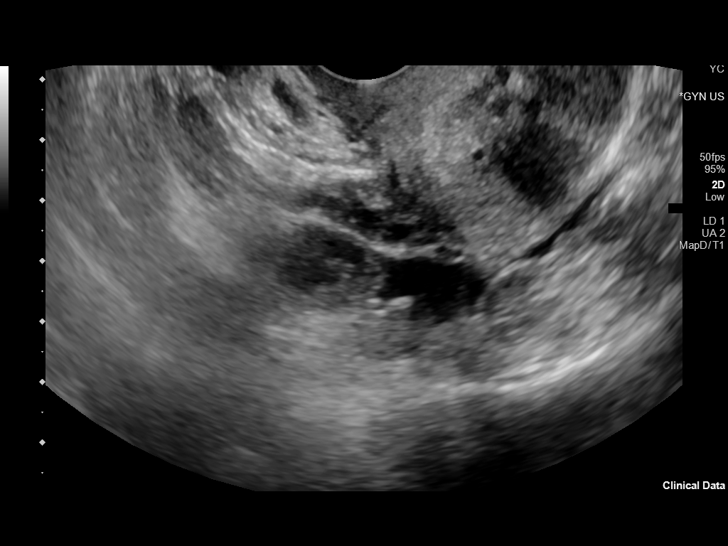
[im 39/67]
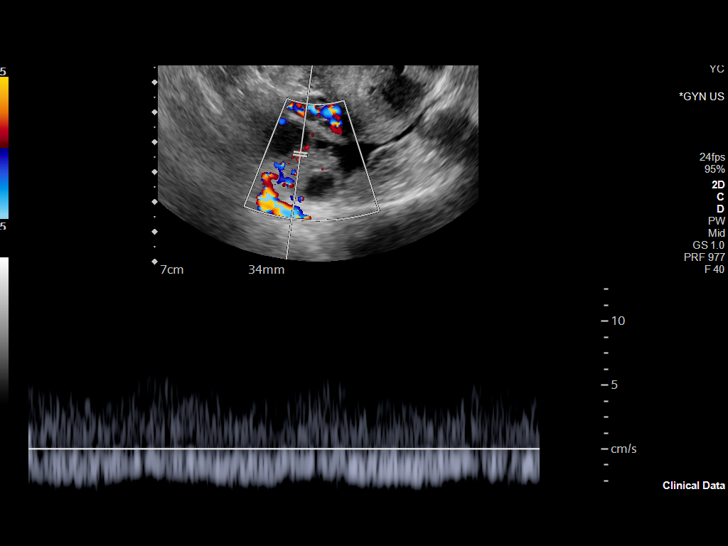
[im 45/67]
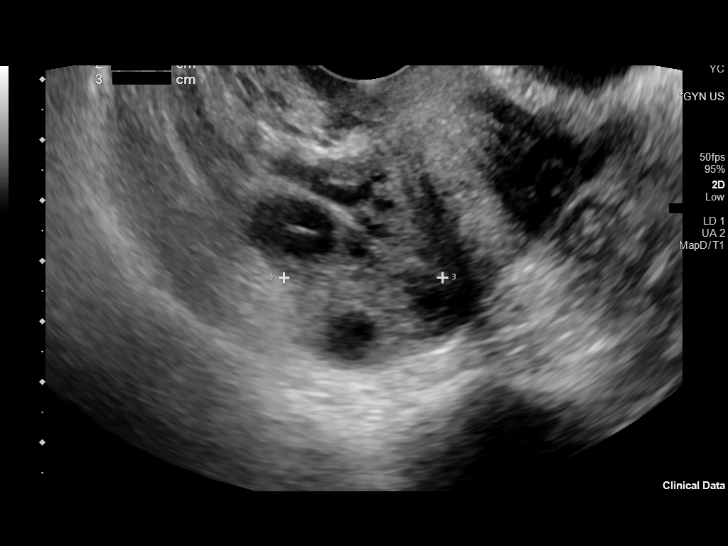
[im 50/67]
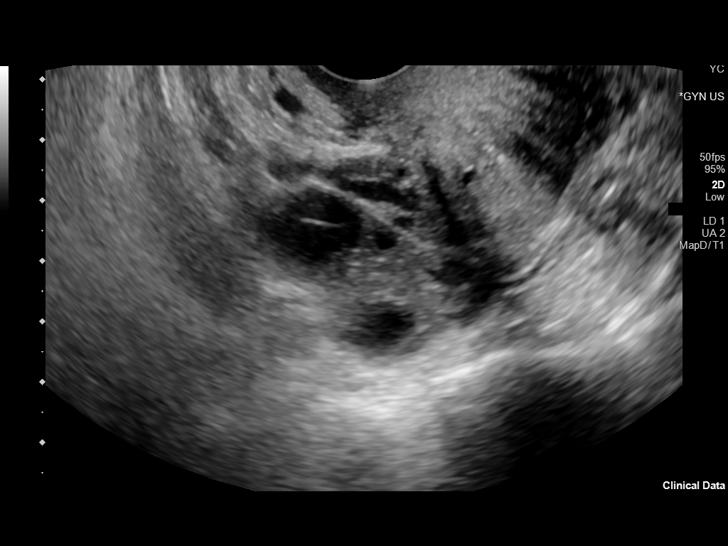
[im 56/67]
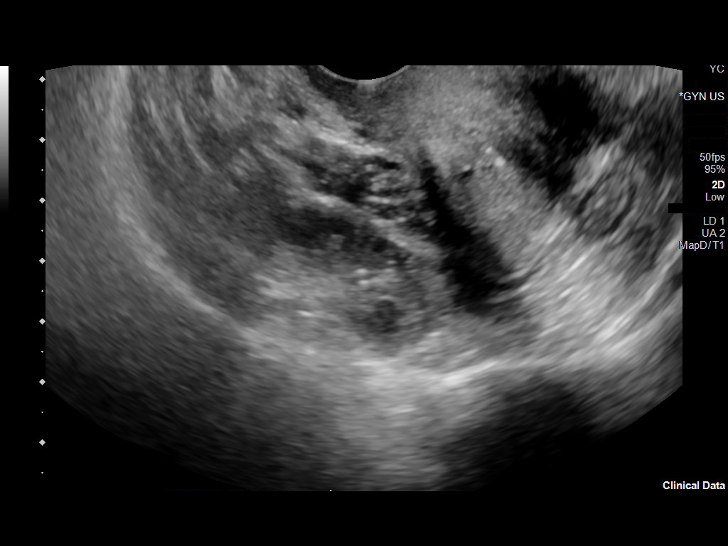
[im 61/67]
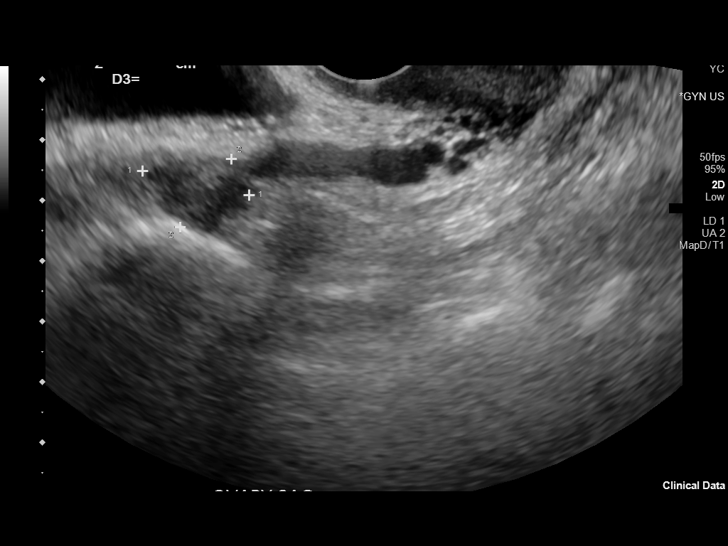
[im 67/67]
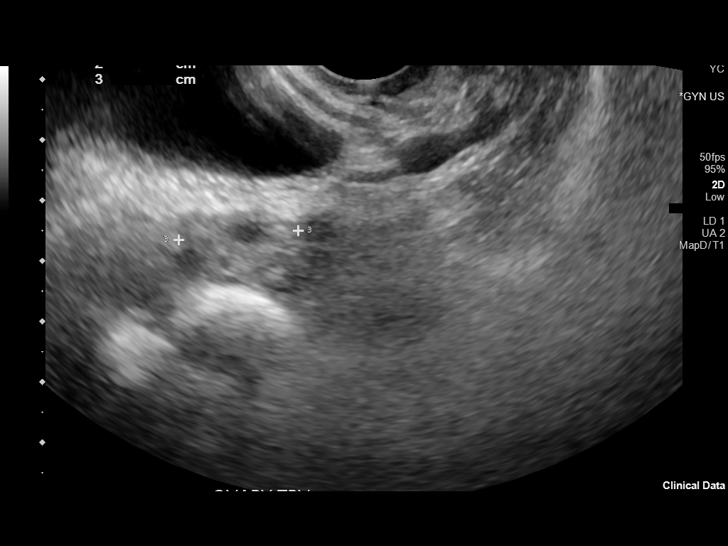

[13 of 25 positions shown; findings below may reference images not displayed]

FINDINGS: Uterus

Measurements: 11.1 x 4.8 x 5.6 cm = volume: 155.2 mL. No fibroids or
other mass visualized.

Endometrium

Thickness: 20.3 mm.  No focal abnormality visualized.

Right ovary

Measurements: 3.3 x 1.9 x 2.6 cm = volume: 8.8 mL. 1.9 x 1.3 x
cm mildly complex cyst with peripherally increased vascularity, most
consistent with a degenerating corpus luteal cyst. Additional
probable small corpus luteal cyst measuring up to 1.3 cm noted as
well. Findings correspond with lesions seen on prior CT. No other
adnexal mass.

Left ovary

Measurements: 1.8 x 1.4 x 2.0 cm = volume: 2.7 mL. Normal
appearance/no adnexal mass.

Pulsed Doppler evaluation of both ovaries demonstrates normal
low-resistance arterial and venous waveforms.

Other findings

Trace free fluid within the pelvis, likely physiologic.
IMPRESSION: 1. Two mildly complex right ovarian cysts measuring up to 1.9 cm,
most consistent with degenerating corpus luteal cysts, corresponding
with findings on prior CT.
2. No evidence for ovarian torsion or other acute abnormality.
3. Thickened endometrium measuring up to 20 mm in thickness,
considered abnormal. Consider follow-up by US in 6-8 weeks, during
the week immediately following menses (exam timing is critical).

## 2023-05-30 ENCOUNTER — Ambulatory Visit: Payer: Medicaid Other | Admitting: Obstetrics & Gynecology

## 2023-06-06 ENCOUNTER — Encounter: Payer: Self-pay | Admitting: Obstetrics and Gynecology

## 2023-06-06 ENCOUNTER — Ambulatory Visit: Payer: Medicaid Other | Admitting: Obstetrics and Gynecology

## 2023-06-06 VITALS — BP 149/87 | HR 79 | Ht 60.0 in | Wt 165.5 lb

## 2023-06-06 DIAGNOSIS — N924 Excessive bleeding in the premenopausal period: Secondary | ICD-10-CM

## 2023-06-06 DIAGNOSIS — D171 Benign lipomatous neoplasm of skin and subcutaneous tissue of trunk: Secondary | ICD-10-CM

## 2023-06-06 NOTE — Progress Notes (Unsigned)
8/24-9/25 47 yo [id number]   pt had nausea, long menses, cramping , dizzy, no period for 3 months prior, positive vasomotor symptoms. Has always had irregular menses

## 2023-06-06 NOTE — Progress Notes (Unsigned)
Upt at home-neg

## 2023-06-07 LAB — TSH+FREE T4
Free T4: 0.51 ng/dL — ABNORMAL LOW (ref 0.82–1.77)
TSH: 39.8 u[IU]/mL — ABNORMAL HIGH (ref 0.450–4.500)

## 2023-06-07 LAB — FOLLICLE STIMULATING HORMONE: FSH: 3 m[IU]/mL

## 2023-06-07 NOTE — Progress Notes (Signed)
  CC: abnormal bleeding Subjective:    Patient ID: Misty Gamble, female    DOB: 1976/02/23, 47 y.o.   MRN: [id number]  HPI 47 yo G10P6 seen for discussion of abnormal periods.  Pt notes she has always had irregular menses.  Menses do occasionally become regular.  Pt has had successful pregnancies.  Pt recently had a 3 month absence of menses followed by approximately 5 weeks of vaginal bleeding.  Pt has never had 12 months of amenorrhea, but she is starting to have vasomotor symptoms.  Pt also is concerned regarding a left abdominal soft tissue tumor.   Review of Systems     Objective:   Physical Exam Vitals:   06/06/23 1423  BP: (!) 149/87  Pulse: 79   Skin: 3-4 cm soft tissue mass in the left lower abdomen, non tender without abnormal pigmentation.      Assessment & Plan:   1. Abnormal perimenopausal bleeding Will check thyroid panel and FSH to eval menopausal status. Check  pelvic ultrasound, if bleeding continues, pt will need endometrial biopsy. - Follicle stimulating hormone - TSH + free T4 - US PELVIC COMPLETE WITH TRANSVAGINAL; Future  2. Lipoma of abdominal wall Will refer to gen surgery for eval and treatment of abdominal wall mass  - Ambulatory referral to General Surgery  F/u in 1 month for gyn follow up and [place] review.  I spent 30 minutes dedicated to the care of this patient including previsit review of records, face to face time with the patient discussing symptoms and post visit testing.   Warden Fillers, MD Faculty Attending, Center for [facility]

## 2023-06-12 ENCOUNTER — Ambulatory Visit (HOSPITAL_COMMUNITY): Payer: Medicaid Other | Attending: Obstetrics and Gynecology

## 2023-06-12 ENCOUNTER — Telehealth: Payer: Self-pay

## 2023-06-12 NOTE — Telephone Encounter (Signed)
-----   Message from Warden Fillers sent at 06/12/2023  6:51 AM EDT ----- Pt is hypothyroid, suggest outpatient care with PCP for further management

## 2023-06-12 NOTE — Telephone Encounter (Signed)
Tried to call patient regarding lab results with interpreter Claudia. VM not set up, will try again later.  Marcelino Duster, RN

## 2023-06-15 NOTE — Telephone Encounter (Signed)
Attempt to reach patient to inform on results and provider advice.  No answer. Phone keeps ringing with no options of leaving VM.  BienY CMA

## 2023-07-02 ENCOUNTER — Emergency Department (HOSPITAL_BASED_OUTPATIENT_CLINIC_OR_DEPARTMENT_OTHER): Payer: Medicaid Other

## 2023-07-02 ENCOUNTER — Observation Stay (HOSPITAL_BASED_OUTPATIENT_CLINIC_OR_DEPARTMENT_OTHER)
Admission: EM | Admit: 2023-07-02 | Discharge: 2023-07-02 | Disposition: A | Discharge status: Home / Self Care | Payer: Medicaid Other | Attending: Family Medicine | Admitting: Family Medicine

## 2023-07-02 ENCOUNTER — Other Ambulatory Visit: Payer: Self-pay

## 2023-07-02 ENCOUNTER — Encounter (HOSPITAL_BASED_OUTPATIENT_CLINIC_OR_DEPARTMENT_OTHER): Payer: Self-pay

## 2023-07-02 ENCOUNTER — Observation Stay (HOSPITAL_BASED_OUTPATIENT_CLINIC_OR_DEPARTMENT_OTHER): Payer: Medicaid Other

## 2023-07-02 DIAGNOSIS — G44209 Tension-type headache, unspecified, not intractable: Secondary | ICD-10-CM | POA: Diagnosis not present

## 2023-07-02 DIAGNOSIS — R030 Elevated blood-pressure reading, without diagnosis of hypertension: Secondary | ICD-10-CM

## 2023-07-02 DIAGNOSIS — E876 Hypokalemia: Secondary | ICD-10-CM | POA: Insufficient documentation

## 2023-07-02 DIAGNOSIS — Z79899 Other long term (current) drug therapy: Secondary | ICD-10-CM | POA: Insufficient documentation

## 2023-07-02 DIAGNOSIS — R079 Chest pain, unspecified: Secondary | ICD-10-CM | POA: Insufficient documentation

## 2023-07-02 DIAGNOSIS — R0789 Other chest pain: Secondary | ICD-10-CM | POA: Diagnosis not present

## 2023-07-02 DIAGNOSIS — E039 Hypothyroidism, unspecified: Secondary | ICD-10-CM | POA: Diagnosis not present

## 2023-07-02 DIAGNOSIS — R55 Syncope and collapse: Secondary | ICD-10-CM

## 2023-07-02 LAB — ECHOCARDIOGRAM COMPLETE
AR max vel: 2.23 cm2
AV Area VTI: 2.17 cm2
AV Area mean vel: 2.17 cm2
AV Mean grad: 6 mm[Hg]
AV Peak grad: 10.1 mm[Hg]
Ao pk vel: 1.59 m/s
Area-P 1/2: 2.59 cm2
Height: 61 in
S' Lateral: 2.2 cm
Weight: 2652.57 [oz_av]

## 2023-07-02 LAB — CBC WITH DIFFERENTIAL/PLATELET
Abs Immature Granulocytes: 0.02 10*3/uL (ref 0.00–0.07)
Basophils Absolute: 0.1 10*3/uL (ref 0.0–0.1)
Basophils Relative: 1 %
Eosinophils Absolute: 0.3 10*3/uL (ref 0.0–0.5)
Eosinophils Relative: 6 %
HCT: 38.9 % (ref 36.0–46.0)
Hemoglobin: 13.7 g/dL (ref 12.0–15.0)
Immature Granulocytes: 0 %
Lymphocytes Relative: 48 %
Lymphs Abs: 3 10*3/uL (ref 0.7–4.0)
MCH: 31.1 pg (ref 26.0–34.0)
MCHC: 35.2 g/dL (ref 30.0–36.0)
MCV: 88.4 fL (ref 80.0–100.0)
Monocytes Absolute: 0.4 10*3/uL (ref 0.1–1.0)
Monocytes Relative: 7 %
Neutro Abs: 2.4 10*3/uL (ref 1.7–7.7)
Neutrophils Relative %: 38 %
Platelets: 249 10*3/uL (ref 150–400)
RBC: 4.4 MIL/uL (ref 3.87–5.11)
RDW: 12.4 % (ref 11.5–15.5)
WBC: 6.2 10*3/uL (ref 4.0–10.5)
nRBC: 0 % (ref 0.0–0.2)

## 2023-07-02 LAB — COMPREHENSIVE METABOLIC PANEL
ALT: 30 U/L (ref 0–44)
AST: 32 U/L (ref 15–41)
Albumin: 4.9 g/dL (ref 3.5–5.0)
Alkaline Phosphatase: 78 U/L (ref 38–126)
Anion gap: 12 (ref 5–15)
BUN: 15 mg/dL (ref 6–20)
CO2: 27 mmol/L (ref 22–32)
Calcium: 10.1 mg/dL (ref 8.9–10.3)
Chloride: 100 mmol/L (ref 98–111)
Creatinine, Ser: 0.77 mg/dL (ref 0.44–1.00)
GFR, Estimated: >60 mL/min (ref >60)
Glucose, Bld: 155 mg/dL — ABNORMAL HIGH (ref 70–99)
Potassium: 3.1 mmol/L — ABNORMAL LOW (ref 3.5–5.1)
Sodium: 139 mmol/L (ref 135–145)
Total Bilirubin: 0.5 mg/dL (ref 0.3–1.2)
Total Protein: 8.5 g/dL — ABNORMAL HIGH (ref 6.5–8.1)

## 2023-07-02 LAB — TROPONIN I (HIGH SENSITIVITY)
Troponin I (High Sensitivity): 3 ng/L (ref <18)
Troponin I (High Sensitivity): 12 ng/L (ref <18)

## 2023-07-02 LAB — CBG MONITORING, ED: Glucose-Capillary: 180 mg/dL — ABNORMAL HIGH (ref 70–99)

## 2023-07-02 LAB — HCG, SERUM, QUALITATIVE: Preg, Serum: NEGATIVE

## 2023-07-02 LAB — TSH: TSH: 51.324 u[IU]/mL — ABNORMAL HIGH (ref 0.350–4.500)

## 2023-07-02 LAB — LIPASE, BLOOD: Lipase: 50 U/L (ref 11–51)

## 2023-07-02 LAB — HIV ANTIBODY (ROUTINE TESTING W REFLEX): HIV Screen 4th Generation wRfx: NONREACTIVE

## 2023-07-02 MED ORDER — NITROGLYCERIN 0.4 MG SL SUBL
0.4000 mg | SUBLINGUAL_TABLET | Freq: Once | SUBLINGUAL | Status: AC
  Administered 2023-07-02: 0.4 mg via SUBLINGUAL
  Filled 2023-07-02: qty 1

## 2023-07-02 MED ORDER — SENNOSIDES-DOCUSATE SODIUM 8.6-50 MG PO TABS
1.0000 | ORAL_TABLET | Freq: Every evening | ORAL | Status: DC | PRN
Start: 2023-07-02 — End: 2023-07-02

## 2023-07-02 MED ORDER — ACETAMINOPHEN 325 MG PO TABS
650.0000 mg | ORAL_TABLET | Freq: Four times a day (QID) | ORAL | Status: DC | PRN
  Administered 2023-07-02: 650 mg via ORAL
  Filled 2023-07-02: qty 2

## 2023-07-02 MED ORDER — LABETALOL HCL 5 MG/ML IV SOLN: 10.0000 mg | Freq: Four times a day (QID) | INTRAVENOUS | Status: DC | PRN

## 2023-07-02 MED ORDER — ONDANSETRON HCL 4 MG PO TABS: 4.0000 mg | ORAL_TABLET | Freq: Four times a day (QID) | ORAL | Status: DC | PRN

## 2023-07-02 MED ORDER — POTASSIUM CHLORIDE CRYS ER 20 MEQ PO TBCR
40.0000 meq | EXTENDED_RELEASE_TABLET | Freq: Once | ORAL | Status: AC
  Administered 2023-07-02: 40 meq via ORAL
  Filled 2023-07-02: qty 2

## 2023-07-02 MED ORDER — ONDANSETRON HCL 4 MG/2ML IJ SOLN: 4.0000 mg | Freq: Four times a day (QID) | INTRAMUSCULAR | Status: DC | PRN

## 2023-07-02 MED ORDER — IOHEXOL 350 MG/ML SOLN
100.0000 mL | Freq: Once | INTRAVENOUS | Status: AC | PRN
  Administered 2023-07-02: 100 mL via INTRAVENOUS

## 2023-07-02 MED ORDER — LEVOTHYROXINE SODIUM 100 MCG PO TABS: 100.0000 ug | ORAL_TABLET | Freq: Every day | ORAL | 1 refills | Status: AC

## 2023-07-02 MED ORDER — KETOROLAC TROMETHAMINE 30 MG/ML IJ SOLN
15.0000 mg | Freq: Once | INTRAMUSCULAR | Status: AC
  Administered 2023-07-02: 15 mg via INTRAVENOUS
  Filled 2023-07-02: qty 1

## 2023-07-02 MED ORDER — ASPIRIN 81 MG PO CHEW
324.0000 mg | CHEWABLE_TABLET | Freq: Once | ORAL | Status: AC
  Administered 2023-07-02: 324 mg via ORAL
  Filled 2023-07-02: qty 4

## 2023-07-02 MED ORDER — METOCLOPRAMIDE HCL 5 MG/ML IJ SOLN
10.0000 mg | Freq: Once | INTRAMUSCULAR | Status: AC
  Administered 2023-07-02: 10 mg via INTRAVENOUS
  Filled 2023-07-02: qty 2

## 2023-07-02 MED ORDER — ENOXAPARIN SODIUM 40 MG/0.4ML IJ SOSY
40.0000 mg | PREFILLED_SYRINGE | INTRAMUSCULAR | Status: DC
  Filled 2023-07-02: qty 0.4

## 2023-07-02 MED ORDER — SODIUM CHLORIDE 0.9% FLUSH
3.0000 mL | Freq: Two times a day (BID) | INTRAVENOUS | Status: DC
  Administered 2023-07-02: 3 mL via INTRAVENOUS

## 2023-07-02 MED ORDER — ACETAMINOPHEN 650 MG RE SUPP: 650.0000 mg | Freq: Four times a day (QID) | RECTAL | Status: DC | PRN

## 2023-07-02 MED ORDER — SODIUM CHLORIDE 0.9% FLUSH: 3.0000 mL | INTRAVENOUS | Status: DC | PRN

## 2023-07-02 MED ORDER — LEVOTHYROXINE SODIUM 100 MCG PO TABS
100.0000 ug | ORAL_TABLET | Freq: Every day | ORAL | Status: DC
  Administered 2023-07-02: 100 ug via ORAL
  Filled 2023-07-02: qty 1

## 2023-07-02 MED ORDER — SODIUM CHLORIDE 0.9 % IV SOLN: 250.0000 mL | INTRAVENOUS | Status: DC | PRN

## 2023-07-02 MED ORDER — DIPHENHYDRAMINE HCL 50 MG/ML IJ SOLN
25.0000 mg | Freq: Once | INTRAMUSCULAR | Status: AC
  Administered 2023-07-02: 25 mg via INTRAVENOUS
  Filled 2023-07-02: qty 1

## 2023-07-02 NOTE — ED Provider Notes (Signed)
[place] EMERGENCY DEPARTMENT AT [facility] Provider Note   CSN: [id number] Arrival date & time: 07/02/23  0028     History  Chief Complaint  Patient presents with   unresponsive    Misty Gamble is a 47 y.o. female.  Level 5 caveat for acuity of condition.  Patient pulled from car unresponsive.  She was breathing with pulse present but somnolent and did not respond to verbal stimuli or physical stimuli.  IV access established.  CBG 180.  Patient breathing on her own and becoming more arousable.  Does respond to verbal questions.  States she is having chest pain as well as headache.  Uncertain when her chest pain and headache started but she states they started at the same time and feels like her chest pain is spreading up to her head.  Chest pain is of the left side and constant with some shortness of breath.  No nausea, vomiting, cough or fever.  Does not know what time the chest pain started.  States he started the same time as her headache.  She is never had this kind of pain before.  Denies cardiac history.  Has a history of heart murmur but no heart attacks.  Does have a history of gestational diabetes.  States she is never had a heart attack.  No history of VTE or PE.  No history of elevated blood pressure.  Family at bedside provides additional history.  They state patient was at a birthday party when there was a verbal disagreement between family members and patient developed chest pain as well as a headache.  The headache is described as a warm hot sensation to her head that was gradual in onset associated with "crushing" chest pain and shortness of breath.  Her husband drove her to the hospital where she apparently lost consciousness in the car and they were concerned that she could have stopped breathing for several minutes.  On arrival she was awake and answering questions after arriving to an exam room.  Family reports history of cardiac murmur.  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  The details of this are unknown.  The history is provided by the patient. The history is limited by the condition of the patient and a language barrier. A language interpreter was used.       Home Medications Prior to Admission medications   Medication Sig Start Date End Date Taking? Authorizing Provider  drospirenone-ethinyl estradiol (YAZ) 3-0.02 MG tablet Take 1 tablet by mouth daily. 10/14/20   Adam Phenix, MD  Multiple Vitamin (MULTIVITAMIN WITH MINERALS) TABS tablet Take 1 tablet by mouth daily.    [provider]      Allergies    Ciprofloxacin    Review of Systems   Review of Systems  Unable to perform ROS: Mental status change    Physical Exam Updated Vital Signs BP (!) 188/96 (BP Location: Right Arm)   Pulse 91   Temp 98.3 F (36.8 C) (Oral)   Resp 14   Ht 5' (1.524 m)   Wt 80.7 kg   LMP 04/28/2023 (Exact Date)   SpO2 100%   BMI 34.76 kg/m  Physical Exam Vitals and nursing note reviewed.  Constitutional:      General: She is not in acute distress.    Appearance: She is well-developed.     Comments: Somnolent, slow to respond.  Answers some questions appropriately and follows commands.  HENT:     Head: Normocephalic and atraumatic.     Mouth/Throat:     Pharynx: No oropharyngeal exudate.  Eyes:     Conjunctiva/sclera: Conjunctivae normal.     Pupils: Pupils are equal, round, and reactive to light.  Neck:     Comments: No meningismus. Cardiovascular:     Rate and Rhythm: Normal rate and regular rhythm.     Heart sounds: Normal heart sounds. No murmur heard. Pulmonary:     Effort: Pulmonary effort is normal. No respiratory distress.     Breath sounds: Normal breath sounds.  Abdominal:     Palpations: Abdomen is soft.     Tenderness: There is no abdominal tenderness. There is no guarding or rebound.  Musculoskeletal:        General: No tenderness. Normal range of  motion.     Cervical back: Normal range of motion and neck supple.  Skin:    General: Skin is warm.  Neurological:     Mental Status: She is alert and oriented to person, place, and time.     Cranial Nerves: No cranial nerve deficit.     Motor: No abnormal muscle tone.     Coordination: Coordination normal.     Comments:  5/5 strength throughout. CN 2-12 intact.Equal grip strength.   Psychiatric:        Behavior: Behavior normal.     ED Results / Procedures / Treatments   Labs (all labs ordered are listed, but only abnormal results are displayed) Labs Reviewed  COMPREHENSIVE METABOLIC PANEL - Abnormal; Notable for the following components:      Result Value   Potassium 3.1 (*)    Glucose, Bld 155 (*)    Total Protein 8.5 (*)    All other components within normal limits  CBG MONITORING, ED - Abnormal; Notable for the following components:   Glucose-Capillary 180 (*)    All other components within normal limits  CBC WITH DIFFERENTIAL/PLATELET  LIPASE, BLOOD  HCG, SERUM, QUALITATIVE  TROPONIN I (HIGH SENSITIVITY)  TROPONIN I (HIGH SENSITIVITY)    EKG EKG Interpretation Date/Time:  Monday July 02 2023 00:37:21 EDT Ventricular Rate:  74 PR Interval:  173 QRS Duration:  94 QT Interval:  390 QTC Calculation: 433 R Axis:   79  Text Interpretation: Sinus rhythm Minimal ST depression, inferior leads No significant change was found Confirmed by Glynn Octave [phone]) on 07/02/2023 12:46:00 AM  Radiology CT Angio Chest/Abd/Pel for Dissection W and/or Wo Contrast  Result Date: 07/02/2023 CLINICAL DATA:  Acute aortic syndrome (AAS) suspected. Pt to ED main entrance with family found unresponsive in car. Echocardiogram showed heart murmur in 2013. Cholecystectomy in 2013. EXAM: CT ANGIOGRAPHY CHEST, ABDOMEN AND PELVIS TECHNIQUE: Non-contrast CT of the chest was initially obtained. Multidetector CT imaging through the chest, abdomen and pelvis was performed using the standard  protocol during bolus administration of intravenous contrast. Multiplanar reconstructed images and MIPs were obtained and reviewed to evaluate the vascular anatomy. RADIATION DOSE REDUCTION: This exam was performed according to the departmental dose-optimization program which includes automated exposure control, adjustment of the mA and/or kV according to patient size and/or use of iterative reconstruction technique. CONTRAST:  OMNIPAQUE IOHEXOL 350 MG/ML SOLN COMPARISON:  CT abdomen pelvis 09/29/20 FINDINGS: CTA CHEST FINDINGS Cardiovascular: Preferential opacification of the thoracic aorta. No evidence of thoracic aortic aneurysm or dissection. Normal heart size. No significant pericardial effusion. The thoracic aorta is normal in caliber. No atherosclerotic plaque of the thoracic aorta. No  coronary artery calcifications. The main pulmonary artery is normal in caliber. No central or segmental pulmonary embolus. Limited evaluation more distally due to timing of contrast. Mediastinum/Nodes: No enlarged mediastinal, hilar, or axillary lymph nodes. Thyroid gland, trachea, and esophagus demonstrate no significant findings. Lungs/Pleura: No focal consolidation. No pulmonary nodule. No pulmonary mass. No pleural effusion. No pneumothorax. Musculoskeletal: No chest wall abnormality. No suspicious lytic or blastic osseous lesions. No acute displaced fracture. Review of the MIP images confirms the above findings. CTA ABDOMEN AND PELVIS FINDINGS VASCULAR Aorta: Normal caliber aorta without aneurysm, dissection, vasculitis or significant stenosis. Celiac: Patent without evidence of aneurysm, dissection, vasculitis or significant stenosis. SMA: Patent without evidence of aneurysm, dissection, vasculitis or significant stenosis. Renals: Both renal arteries are patent without evidence of aneurysm, dissection, vasculitis, fibromuscular dysplasia or significant stenosis. IMA: Patent without evidence of aneurysm, dissection,  vasculitis or significant stenosis. Inflow: Patent without evidence of aneurysm, dissection, vasculitis or significant stenosis. Veins: No obvious venous abnormality within the limitations of this arterial phase study. Review of the MIP images confirms the above findings. NON-VASCULAR Hepatobiliary: No focal liver abnormality. Status post cholecystectomy. No biliary dilatation. Pancreas: Unremarkable. No pancreatic ductal dilatation or surrounding inflammatory changes. Spleen: Normal in size without focal abnormality. Adrenals/Urinary Tract: No adrenal nodule bilaterally. Bilateral kidneys enhance symmetrically. No hydronephrosis. No hydroureter. The urinary bladder is unremarkable. Stomach/Bowel: Stomach is within normal limits. No evidence of bowel wall thickening or dilatation. Appendix appears normal. Lymphatic: No lymphadenopathy. Reproductive: Uterus and bilateral adnexa are unremarkable. Other: No intraperitoneal free fluid. No intraperitoneal free gas. No organized fluid collection. Musculoskeletal: No abdominal wall hernia or abnormality. No suspicious lytic or blastic osseous lesions. No acute displaced fracture. Review of the MIP images confirms the above findings. IMPRESSION: 1. No acute vascular abnormality. 2. No acute intrathoracic, intra-abdominal, intrapelvic abnormality. Electronically Signed   By: Tish Frederickson M.D.   On: 07/02/2023 02:44   CT Head Wo Contrast  Result Date: 07/02/2023 CLINICAL DATA:  Headache, sudden, severe. Patient found unresponsive in car EXAM: CT HEAD WITHOUT CONTRAST TECHNIQUE: Contiguous axial images were obtained from the base of the skull through the vertex without intravenous contrast. RADIATION DOSE REDUCTION: This exam was performed according to the departmental dose-optimization program which includes automated exposure control, adjustment of the mA and/or kV according to patient size and/or use of iterative reconstruction technique. COMPARISON:  CT and MRI  head 08/30/2006 FINDINGS: Brain: No intracranial hemorrhage, mass effect, or evidence of acute infarct. No hydrocephalus. No extra-axial fluid collection. Gray-white matter differentiation is preserved. Vascular: No hyperdense vessel or unexpected calcification. Skull: No fracture or focal lesion. Sinuses/Orbits: Mucosal thickening in the paranasal sinuses. No mastoid effusion. Other: None. IMPRESSION: No acute intracranial abnormality. Mucosal thickening in the paranasal sinuses. Electronically Signed   By: Minerva Fester M.D.   On: 07/02/2023 02:36   DG Chest Portable 1 View  Result Date: 07/02/2023 CLINICAL DATA:  Chest pain unresponsive EXAM: PORTABLE CHEST 1 VIEW COMPARISON:  08/30/2010 FINDINGS: The heart size and mediastinal contours are within normal limits. Both lungs are clear. The visualized skeletal structures are unremarkable. IMPRESSION: No active disease. Electronically Signed   By: Jasmine Pang M.D.   On: 07/02/2023 00:53    Procedures Procedures    Medications Ordered in ED Medications  aspirin chewable tablet 324 mg (has no administration in time range)  nitroGLYCERIN (NITROSTAT) SL tablet 0.4 mg (has no administration in time range)    ED Course/ Medical Decision Making/ A&P  Medical Decision Making Amount and/or Complexity of Data Reviewed Independent Historian: caregiver Labs: ordered. Decision-making details documented in ED Course. Radiology: ordered and independent interpretation performed. Decision-making details documented in ED Course. ECG/medicine tests: ordered and independent interpretation performed. Decision-making details documented in ED Course.  Risk OTC drugs. Prescription drug management. Decision regarding hospitalization.   Episode of chest pain headache and possible loss of consciousness.  Unresponsive on arrival but now awake and alert and answering questions.  Hypertensive but stable vital signs.  EKG without  acute ischemia.  This shows subtle ST depressions inferiorly.  EKG without acute ST elevation.  Neurological exam is nonfocal.  Patient complains of headache and chest pain that onset at the same time with some concern for syncope.  On arrival she was mostly unresponsive in the car but was awake and answering questions by the time she was brought back to an exam room.  Workup is reassuring.  CT head is negative for hemorrhage.  CT angiogram negative for aortic dissection or pulmonary embolism.  Results reviewed and interpreted by me.  Chest pain and blood pressure have improved with nitroglycerin.  Blood pressure improving.  Does not take blood pressure medication at home.  Troponin negative.  Workup reassuring as above with negative CT imaging for aortic dissection or pulmonary embolism.  Chest pain has mostly resolved with aspirin nitroglycerin was still having a headache.  Family reports she does get a history of these headaches frequently.  Headache did come on suddenly today while there was a stressful situation at home. Negative for hemorrhage within 6 hours of sudden onset headache effectively rules out subarachnoid hemorrhage by recent studies.  Concern for episode of chest pain with syncope and loss of consciousness.  Would benefit from echocardiogram and possible stress test.  Symptoms may be related to panic attack and anxiety but does not explain loss of consciousness.  May have had hyperventilation.  Observation admission discussed with Dr. Julian Reil.       Final Clinical Impression(s) / ED Diagnoses Final diagnoses:  Syncope and collapse  Chest pain, unspecified type    Rx / DC Orders ED Discharge Orders     None         Marlis Oldaker, Jeannett Senior, MD 07/02/23 [phone]

## 2023-07-02 NOTE — H&P (Addendum)
History and Physical    Misty Gamble ZOX:[id number] DOB: 1976/01/17 DOA: 07/02/2023  PCP: Barbette Merino, NP   Patient coming from: Home   Chief Complaint:  Chief Complaint  Patient presents with   unresponsive   ED TRIAGE note:Pt to ED main entrance with family found unresponsive in car. Pt placed in wheelchair to RR. PT placed on stretcher, Dr Manus Gunning to bedside. PT responded verbally to questions from provider. PT denies drugs ETOH. PT placed on monitor, EKG, IV, Labs drawn,Xray completed.    HPI:  Misty Gamble is a 47 y.o. female with medical history of gestational diabetes mellitus presented to ED for unresponsive episode.  Patient initially presented to [facility] emergency department.  Patient had argument with the family and in the car she became sudden unresponsive.  Family found unresponsive in the car.  During evaluation by ED physician at the bedside patient was breathing, having pulse but still moaning but did not response to verbal stimuli or physical stimuli.  IV access established in the ED.  Patient maintained breathing on her home and suddenly become arousable and started responding to verbal questions.  She was expressing having some chest pain and headache.  Patient reported chest pain on the left side of the chest constant in nature with associated shortness of breath.  She denies any nausea, vomiting, fever, chill, and cough.  Unable to specify when the chest pain has been started.  She also reported having some headache as well at the same time when the chest pain started.  Denies any previous cardiac history.  Patient denies any history of heart attack however reported she has history of heart murmur.  No previous history of DVT and PE in the past. Patient's family member at the bedside reported that patient was at a birthday party when there was a verbal disagreement between family members and patient developed chest pain as well as a  headache.  The headache is described as a warm hot sensation to her head that was gradual in onset associated with "crushing" chest pain and shortness of breath.  Her husband drove her to the hospital where she apparently lost consciousness in the car and they were concerned that she could have stopped breathing for several minutes. On arrival she was awake and answering questions after arriving to an exam room. Family reports history of cardiac murmur.  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  The details of this are unknown.  ED Course:  At initial presentation to ED blood pressure was 192/133, respiratory rate 30, heart rate 82 and O2 sat 100% room air.  Blood pressure improved to 126/80 without any intervention.  For the headache patient was given Toradol, Reglan. Lab work, CBG blood glucose 180 WNL. CBC unremarkable CMP showing slightly low potassium 3.1 otherwise unremarkable. High-sensitivity troponin 3.EKG showed normal sinus rhythm heart rate 79 no ST anterior abnormality Pregnancy test negative.  Chest x-ray unremarkable.  For the syncopal episode CT head obtained.   CT head normal finding. CT chest, abdomen and pelvis normal finding.  ED physician requested admission for syncopal episode and need for echocardiogram with stress test.  Hospitalist has been contacted regarding that and transferred to the [facility] regarding this workup.  Review of Systems:  Review of Systems  Constitutional:  Negative for chills, fever, malaise/fatigue and weight loss.  Eyes:  Negative for blurred vision.  Respiratory:  Negative for cough and shortness of breath.  Cardiovascular:  Negative for chest pain, palpitations, orthopnea, claudication and leg swelling.  Gastrointestinal:  Negative for heartburn and nausea.  Neurological:  Positive for loss of consciousness. Negative for dizziness, tremors, sensory change, speech change, focal weakness, seizures and  headaches.  Psychiatric/Behavioral:  The patient is not nervous/anxious.     Past Medical History:  Diagnosis Date   Aortic heart murmur on examination 08/19/2012   No previous Echo    Elevated liver enzymes 08/19/2012   Gestational diabetes    Gestational diabetes mellitus 06/02/2015   Versus pre-gestation as early 1-hr 150; 3-hour 97/109/162/130 (per phone contact with HD, actual 3-hour results to be sent)      Past Surgical History:  Procedure Laterality Date   CHOLECYSTECTOMY       reports that she has never smoked. She has never used smokeless tobacco. She reports that she does not drink alcohol and does not use drugs.  Allergies  Allergen Reactions   Ciprofloxacin Rash    Family History  Problem Relation Age of Onset   Healthy Mother    Healthy Father     Prior to Admission medications   Medication Sig Start Date End Date Taking? Authorizing Provider  drospirenone-ethinyl estradiol (YAZ) 3-0.02 MG tablet Take 1 tablet by mouth daily. 10/14/20   Adam Phenix, MD  Multiple Vitamin (MULTIVITAMIN WITH MINERALS) TABS tablet Take 1 tablet by mouth daily.    [provider]     Physical Exam: Vitals:   07/02/23 0400 07/02/23 0415 07/02/23 0430 07/02/23 0549  BP:    126/80  Pulse: (!) 120 88 86 86  Resp:  18 13 16   Temp:    98 F (36.7 C)  TempSrc:    Oral  SpO2: 99% 98% 98% 98%  Weight:    75.2 kg  Height:    5\' 1"  (1.549 m)    Physical Exam Constitutional:      General: She is not in acute distress.    Appearance: She is not ill-appearing.  HENT:     Mouth/Throat:     Mouth: Mucous membranes are moist.  Eyes:     Conjunctiva/sclera: Conjunctivae normal.  Cardiovascular:     Rate and Rhythm: Normal rate and regular rhythm.     Pulses: Normal pulses.     Heart sounds: Normal heart sounds.  Pulmonary:     Effort: Pulmonary effort is normal.     Breath sounds: Normal breath sounds.  Abdominal:     General: Bowel sounds are normal. There is  no distension.  Musculoskeletal:     Right lower leg: No edema.     Left lower leg: No edema.  Skin:    General: Skin is dry.  Neurological:     Mental Status: She is alert and oriented to person, place, and time.  Psychiatric:        Mood and Affect: Mood normal.        Thought Content: Thought content normal.      Labs on Admission: I have personally reviewed following labs and imaging studies  CBC: Recent Labs  Lab 07/02/23 0056  WBC 6.2  NEUTROABS 2.4  HGB 13.7  HCT 38.9  MCV 88.4  PLT 249   Basic Metabolic Panel: Recent Labs  Lab 07/02/23 0056  NA 139  K 3.1*  CL 100  CO2 27  GLUCOSE 155*  BUN 15  CREATININE 0.77  CALCIUM 10.1   GFR: Estimated Creatinine Clearance: 79 mL/min (by C-G formula based on  SCr of 0.77 mg/dL). Liver Function Tests: Recent Labs  Lab 07/02/23 0056  AST 32  ALT 30  ALKPHOS 78  BILITOT 0.5  PROT 8.5*  ALBUMIN 4.9   Recent Labs  Lab 07/02/23 0056  LIPASE 50   No results for input(s): "AMMONIA" in the last 168 hours. Coagulation Profile: No results for input(s): "INR", "PROTIME" in the last 168 hours. Cardiac Enzymes: Recent Labs  Lab 07/02/23 0056 07/02/23 0555  TROPONINIHS 3 12   BNP (last 3 results) No results for input(s): "BNP" in the last 8760 hours. HbA1C: No results for input(s): "HGBA1C" in the last 72 hours. CBG: Recent Labs  Lab 07/02/23 0031  GLUCAP 180*   Lipid Profile: No results for input(s): "CHOL", "HDL", "LDLCALC", "TRIG", "CHOLHDL", "LDLDIRECT" in the last 72 hours. Thyroid Function Tests: No results for input(s): "TSH", "T4TOTAL", "FREET4", "T3FREE", "THYROIDAB" in the last 72 hours. Anemia Panel: No results for input(s): "VITAMINB12", "FOLATE", "FERRITIN", "TIBC", "IRON", "RETICCTPCT" in the last 72 hours. Urine analysis:    Component Value Date/Time   COLORURINE STRAW (A) 09/28/2020 2357   APPEARANCEUR CLEAR 09/28/2020 2357   LABSPEC 1.010 09/28/2020 2357   PHURINE 7.0 09/28/2020  2357   GLUCOSEU NEGATIVE 09/28/2020 2357   HGBUR NEGATIVE 09/28/2020 2357   BILIRUBINUR negative 10/18/2020 1518   KETONESUR negative 10/18/2020 1518   KETONESUR NEGATIVE 09/28/2020 2357   PROTEINUR NEGATIVE 09/28/2020 2357   UROBILINOGEN 0.2 10/18/2020 1518   UROBILINOGEN 0.2 08/03/2015 1324   NITRITE Negative 10/18/2020 1518   NITRITE NEGATIVE 09/28/2020 2357   LEUKOCYTESUR Negative 10/18/2020 1518   LEUKOCYTESUR NEGATIVE 09/28/2020 2357    Radiological Exams on Admission: I have personally reviewed images CT Angio Chest/Abd/Pel for Dissection W and/or Wo Contrast  Result Date: 07/02/2023 CLINICAL DATA:  Acute aortic syndrome (AAS) suspected. Pt to ED main entrance with family found unresponsive in car. Echocardiogram showed heart murmur in 2013. Cholecystectomy in 2013. EXAM: CT ANGIOGRAPHY CHEST, ABDOMEN AND PELVIS TECHNIQUE: Non-contrast CT of the chest was initially obtained. Multidetector CT imaging through the chest, abdomen and pelvis was performed using the standard protocol during bolus administration of intravenous contrast. Multiplanar reconstructed images and MIPs were obtained and reviewed to evaluate the vascular anatomy. RADIATION DOSE REDUCTION: This exam was performed according to the departmental dose-optimization program which includes automated exposure control, adjustment of the mA and/or kV according to patient size and/or use of iterative reconstruction technique. CONTRAST:  OMNIPAQUE IOHEXOL 350 MG/ML SOLN COMPARISON:  CT abdomen pelvis 09/29/20 FINDINGS: CTA CHEST FINDINGS Cardiovascular: Preferential opacification of the thoracic aorta. No evidence of thoracic aortic aneurysm or dissection. Normal heart size. No significant pericardial effusion. The thoracic aorta is normal in caliber. No atherosclerotic plaque of the thoracic aorta. No coronary artery calcifications. The main pulmonary artery is normal in caliber. No central or segmental pulmonary embolus. Limited  evaluation more distally due to timing of contrast. Mediastinum/Nodes: No enlarged mediastinal, hilar, or axillary lymph nodes. Thyroid gland, trachea, and esophagus demonstrate no significant findings. Lungs/Pleura: No focal consolidation. No pulmonary nodule. No pulmonary mass. No pleural effusion. No pneumothorax. Musculoskeletal: No chest wall abnormality. No suspicious lytic or blastic osseous lesions. No acute displaced fracture. Review of the MIP images confirms the above findings. CTA ABDOMEN AND PELVIS FINDINGS VASCULAR Aorta: Normal caliber aorta without aneurysm, dissection, vasculitis or significant stenosis. Celiac: Patent without evidence of aneurysm, dissection, vasculitis or significant stenosis. SMA: Patent without evidence of aneurysm, dissection, vasculitis or significant stenosis. Renals: Both renal arteries are patent  without evidence of aneurysm, dissection, vasculitis, fibromuscular dysplasia or significant stenosis. IMA: Patent without evidence of aneurysm, dissection, vasculitis or significant stenosis. Inflow: Patent without evidence of aneurysm, dissection, vasculitis or significant stenosis. Veins: No obvious venous abnormality within the limitations of this arterial phase study. Review of the MIP images confirms the above findings. NON-VASCULAR Hepatobiliary: No focal liver abnormality. Status post cholecystectomy. No biliary dilatation. Pancreas: Unremarkable. No pancreatic ductal dilatation or surrounding inflammatory changes. Spleen: Normal in size without focal abnormality. Adrenals/Urinary Tract: No adrenal nodule bilaterally. Bilateral kidneys enhance symmetrically. No hydronephrosis. No hydroureter. The urinary bladder is unremarkable. Stomach/Bowel: Stomach is within normal limits. No evidence of bowel wall thickening or dilatation. Appendix appears normal. Lymphatic: No lymphadenopathy. Reproductive: Uterus and bilateral adnexa are unremarkable. Other: No intraperitoneal free  fluid. No intraperitoneal free gas. No organized fluid collection. Musculoskeletal: No abdominal wall hernia or abnormality. No suspicious lytic or blastic osseous lesions. No acute displaced fracture. Review of the MIP images confirms the above findings. IMPRESSION: 1. No acute vascular abnormality. 2. No acute intrathoracic, intra-abdominal, intrapelvic abnormality. Electronically Signed   By: Tish Frederickson M.D.   On: 07/02/2023 02:44   CT Head Wo Contrast  Result Date: 07/02/2023 CLINICAL DATA:  Headache, sudden, severe. Patient found unresponsive in car EXAM: CT HEAD WITHOUT CONTRAST TECHNIQUE: Contiguous axial images were obtained from the base of the skull through the vertex without intravenous contrast. RADIATION DOSE REDUCTION: This exam was performed according to the departmental dose-optimization program which includes automated exposure control, adjustment of the mA and/or kV according to patient size and/or use of iterative reconstruction technique. COMPARISON:  CT and MRI head 08/30/2006 FINDINGS: Brain: No intracranial hemorrhage, mass effect, or evidence of acute infarct. No hydrocephalus. No extra-axial fluid collection. Gray-white matter differentiation is preserved. Vascular: No hyperdense vessel or unexpected calcification. Skull: No fracture or focal lesion. Sinuses/Orbits: Mucosal thickening in the paranasal sinuses. No mastoid effusion. Other: None. IMPRESSION: No acute intracranial abnormality. Mucosal thickening in the paranasal sinuses. Electronically Signed   By: Minerva Fester M.D.   On: 07/02/2023 02:36   DG Chest Portable 1 View  Result Date: 07/02/2023 CLINICAL DATA:  Chest pain unresponsive EXAM: PORTABLE CHEST 1 VIEW COMPARISON:  08/30/2010 FINDINGS: The heart size and mediastinal contours are within normal limits. Both lungs are clear. The visualized skeletal structures are unremarkable. IMPRESSION: No active disease. Electronically Signed   By: Jasmine Pang M.D.   On:  07/02/2023 00:53    EKG: My personal interpretation of EKG shows: Sinus rhythm heart rate 76.  There is no ST-T wave abnormality.    Assessment/Plan: Principal Problem:   Syncope Active Problems:   Hypokalemia    Assessment and Plan: Syncope -Patient had a argument with the family afterward patient had a warm sensation around her body with associated chest pressure/chest pain and headache followed by syncopal episode while in the car.  At presentation to ED patient was breathing on her own, arousable and hemodynamically stable except elevated blood pressure.  She stated that she is experiencing chest chest pain and headache while in the ED.  Patient's husband reported that patient had a syncopal episode in the car. -I presentation to ED patient blood pressure found elevated 192/113 which improved dramatically to 126/80 after patient received Toradol and her headache subsided. - EKG showed normal sinus rhythm.  Flat troponin. -Blood alcohol of less than 10. - CT head no acute abnormality. - CTA chest, abdomen and pelvis normal finding. -Due to syncopal episode, complaining  about chest pain and previous history of cardiac arrest 25 years ago stated by family, patient has been transferred to [facility] for further evaluation and echocardiogram and stress test. -Continue cardiac monitoring - Continue to trend troponin. - Obtaining echocardiogram. - Send epic message to Salley Hews to request for exercise stress test. -Based on the history and clinical presentation seems like that patient developed syncopal episode following argument likely vasovagal origin rather than any cardiac origin however given patient has history of cardiac arrest in the past obtain echocardiogram and stress test.  If everything remains normal patient can be discharged from the hospital today and need to establish care and follow-up with outpatient PCP.  Chest pain-resolved - Patient presented complaining of  crushing like sensation chest pain.  Initial troponin 12.  Second troponin pending.  EKG showed normal sinus rhythm heart rate 72 without any ST and T wave abnormality. - CTA chest ruled any intrathoracic abnormality. -Chest pain likely in the setting of elevated blood pressure versus panic attack. -Checking echocardiogram and planning to obtain exercise stress test-informed cardiology.   Transient hypertension-resolved - Patient denies history of hypertension, MI, stroke in the past.  She cannot remember having an cardiac arrest in the past but patient husband stated that patient had a cardiac arrest 25 years ago. - Initial presentation to ED patient blood pressure was 192/133.  Blood pressure improved without any intervention. - CT abdomen pelvis did not showed any evidence of adrenal adenoma.  Checking TSH level. -it seems like that patient had transient hypertension in the setting of argument versus patient possibly has whitecoat syndrome versus headache associated with elevated blood pressure vice versa. -Continue to monitor blood pressure.  Starting labetalol as needed. -During discharge patient needs direction to check blood pressure twice daily for 7 days, write it down to a logbook and need to follow-up with primary care provider for assessment.  Headache-resolved - Patient initially presented with headache bandlike extension around her head which has been resolved with Toradol and Reglan in the ED. - Continue to monitor.  Continue Tylenol as needed  Hypokalemia -Potassium 3.1.  Repleted with oral Kcl  DVT prophylaxis:  Lovenox Code Status:  Full Code Diet: Heart healthy diet Family Communication:  Family was present at bedside, at the time of interview.  Opportunity was given to ask question and all questions were answered satisfactorily.  Disposition Plan: Tentative discharge home next 1 to 2 days.  Pending echocardiogram and stress test. Consults: Cardiology for stress  test Admission status:   Observation, Telemetry bed  Severity of Illness: The appropriate patient status for this patient is OBSERVATION. Observation status is judged to be reasonable and necessary in order to provide the required intensity of service to ensure the patient's safety. The patient's presenting symptoms, physical exam findings, and initial radiographic and laboratory data in the context of their medical condition is felt to place them at decreased risk for further clinical deterioration. Furthermore, it is anticipated that the patient will be medically stable for discharge from the hospital within 2 midnights of admission.     Tereasa Coop, MD [facility]  How to contact the [facility] Attending or Consulting provider 7A - 7P or covering provider during after hours 7P -7A, for this patient.  Check the care team in [facility] and look for a) attending/consulting [facility] provider listed and b) the [facility] team listed Log into [email or web address] and use [facility] universal password to access. If you do not have the password, please contact  the hospital operator. Locate the [facility] provider you are looking for under [facility] and page to a number that you can be directly reached. If you still have difficulty reaching the provider, please page the [facility] (Director on Call) for the Hospitalists listed on amion for assistance.  07/02/2023, 6:45 AM

## 2023-07-02 NOTE — Progress Notes (Signed)
DC instructions, med list, and written info provided (in [place] and [place]) on hypothyroidism/levothyroxine reviewed with pt and SO.  All questions answered, pt verbalized understanding.  IV and tele removed.  SO brought all belongings to the car already.  Discharged via WC without incident

## 2023-07-02 NOTE — Progress Notes (Signed)
Echocardiogram 2D Echocardiogram has been performed.  Warren Lacy Yazeed Pryer RDCS 07/02/2023, 9:57 AM

## 2023-07-02 NOTE — ED Triage Notes (Signed)
Pt to ED main entrance with family found unresponsive in car. Pt placed in wheelchair to RR. PT placed on stretcher, Dr Manus Gunning to bedside. PT responded verbally to questions from provider. PT denies drugs ETOH. PT placed on monitor, EKG, IV, Labs drawn,Xray completed.

## 2023-07-02 NOTE — Plan of Care (Signed)

## 2023-07-02 NOTE — ED Notes (Signed)
Spanish Interpretor to bedside to update husband with plan of care and obtain history of pt present illness.

## 2023-07-02 NOTE — Discharge Summary (Signed)
Physician Discharge Summary  Zerita Starns QMV:[id number] DOB: 11/01/1975 DOA: 07/02/2023  PCP: Barbette Merino, NP  Admit date: 07/02/2023 Discharge date: 07/02/2023  Time spent: 27 minutes  Recommendations for Outpatient Follow-up:  Needs outpatient TSH T4 in 3 weeks to 6 weeks Recommend regular preventive care per [place] PTF guidelines at hospital follow-up  Discharge Diagnoses:  MAIN problem for hospitalization   Noncardiac chest pain Hypothyroid  Please see below for itemized issues addressed in [facility]- refer to other progress notes for clarity if needed  Discharge Condition: Improved  Diet recommendation: Heart healthy  Filed Weights   07/02/23 0039 07/02/23 0047 07/02/23 0549  Weight: 74.8 kg 80.7 kg 75.2 kg    History of present illness:  47 year old grand multipara known prior GDM presented to the hospital having been found unresponsive in the car after an argument with family She had an unpleasant discussion with her daughter-in-law regarding how her grandson was being raised and became quite emotional about this She developed crushing chest pain warm sensation is a head and came to the emergency room Her prior medical history significant for what sounds like an allergic reaction to Cipro in the process of doing an endoscopy more than 20 years ago in [place]  We worked her up troponins were negative-EKG showed only slight T wave depressions and no ACS pattern and she had no recurrent chest pain Echocardiogram was performed which showed an EF of 60-70% without any diastolic dysfunction parameters present-I discussed with her the need for emotional regulation in addition to stress management I discussed the case briefly with Dr. Anne Fu of cardiology who agrees and no cardiac workup is warranted as the echo is normal  We also talked about her thyroid being off and that her symptoms could be related to dysthymia from hypothyroidism-we elected to start her on  levothyroxine and she will need a TSH in 3 to 6 weeks   Discharge Exam: Vitals:   07/02/23 0739 07/02/23 1119  BP: 124/77 121/78  Pulse:  88  Resp:  16  Temp:  98.5 F (36.9 C)  SpO2:  97%    Subj on day of d/c   Awake coherent pleasant  General Exam on discharge  EOMI NCAT no focal deficit no icterus no pallor Neck soft supple Chest is clear no wheeze rales rhonchi Abdomen soft no rebound ROM intact   Discharge Instructions    Allergies as of 07/02/2023       Reactions   Cipro [ciprofloxacin Hcl] Rash        Medication List     TAKE these medications    ibuprofen 200 MG tablet Commonly known as: ADVIL Take 600 mg by mouth 2 (two) times daily as needed for headache or moderate pain (pain score 4-6).   levothyroxine 100 MCG tablet Commonly known as: SYNTHROID Take 1 tablet (100 mcg total) by mouth daily at 6 (six) AM. Start taking on: July 03, 2023   MENOPAUSE SUPPORT PO Take 1 tablet by mouth daily.   Multivitamin Women Tabs Take 1 tablet by mouth daily.       Allergies  Allergen Reactions   Cipro [Ciprofloxacin Hcl] Rash      The results of significant diagnostics from this hospitalization (including imaging, microbiology, ancillary and laboratory) are listed below for reference.    Significant Diagnostic Studies: ECHOCARDIOGRAM COMPLETE  Result Date: 07/02/2023    ECHOCARDIOGRAM REPORT   Patient Name:   CHAMPAGNE FAUDREE Date of Exam: 07/02/2023 Medical Rec #:  [id number]               Height:       61.0 in Accession #:    [id number]              Weight:       165.8 lb Date of Birth:  [birth year]               BSA:          1.744 m Patient Age:    47 years                BP:           124/77 mmHg Patient Gender: F                       HR:           70 bpm. Exam Location:  Inpatient Procedure: 2D Echo, Color Doppler and Cardiac Doppler Indications:    R55 Syncope  History:        Patient has no prior history of Echocardiogram  examinations.                 Risk Factors:Hypertension.  Sonographer:    Irving Burton Senior RDCS Referring Phys: [phone] SUBRINA SUNDIL IMPRESSIONS  1. Left ventricular ejection fraction, by estimation, is 65 to 70%. The left ventricle has normal function. The left ventricle has no regional wall motion abnormalities. Left ventricular diastolic parameters were normal.  2. Right ventricular systolic function is normal. The right ventricular size is normal. There is normal pulmonary artery systolic pressure.  3. Right atrial size was mildly dilated.  4. The mitral valve is normal in structure. Trivial mitral valve regurgitation.  5. The aortic valve is normal in structure. Aortic valve regurgitation is mild.  6. The inferior vena cava is normal in size with greater than 50% respiratory variability, suggesting right atrial pressure of 3 mmHg. FINDINGS  Left Ventricle: Left ventricular ejection fraction, by estimation, is 65 to 70%. The left ventricle has normal function. The left ventricle has no regional wall motion abnormalities. The left ventricular internal cavity size was normal in size. There is  no left ventricular hypertrophy. Left ventricular diastolic parameters were normal. Right Ventricle: The right ventricular size is normal. No increase in right ventricular wall thickness. Right ventricular systolic function is normal. There is normal pulmonary artery systolic pressure. The tricuspid regurgitant velocity is 2.11 m/s, and  with an assumed right atrial pressure of 3 mmHg, the estimated right ventricular systolic pressure is 20.8 mmHg. Left Atrium: Left atrial size was normal in size. Right Atrium: Right atrial size was mildly dilated. Pericardium: There is no evidence of pericardial effusion. Presence of epicardial fat layer. Mitral Valve: The mitral valve is normal in structure. Trivial mitral valve regurgitation. Tricuspid Valve: The tricuspid valve is normal in structure. Tricuspid valve regurgitation is mild.  Aortic Valve: The aortic valve is normal in structure. Aortic valve regurgitation is mild. Aortic valve mean gradient measures 6.0 mmHg. Aortic valve peak gradient measures 10.1 mmHg. Aortic valve area, by VTI measures 2.17 cm. Pulmonic Valve: The pulmonic valve was normal in structure. Pulmonic valve regurgitation is not visualized. Aorta: The aortic root and ascending aorta are structurally normal, with no evidence of dilitation. Venous: The inferior vena cava is normal in size with greater than 50% respiratory variability, suggesting right atrial pressure of 3 mmHg. IAS/Shunts: No atrial level shunt detected by color flow Doppler.  LEFT VENTRICLE PLAX 2D LVIDd:         3.60 cm   Diastology LVIDs:         2.20 cm   LV e' medial:    8.39 cm/s LV PW:         0.70 cm   LV E/e' medial:  9.1 LV IVS:        0.80 cm   LV e' lateral:   7.93 cm/s LVOT diam:     1.70 cm   LV E/e' lateral: 9.6 LV SV:         71 LV SV Index:   41 LVOT Area:     2.27 cm  RIGHT VENTRICLE RV S prime:     13.20 cm/s TAPSE (M-mode): 2.4 cm LEFT ATRIUM             Index        RIGHT ATRIUM           Index LA diam:        2.40 cm 1.38 cm/m   RA Area:     23.00 cm LA Vol (A2C):   37.2 ml 21.33 ml/m  RA Volume:   69.50 ml  39.85 ml/m LA Vol (A4C):   29.7 ml 17.03 ml/m LA Biplane Vol: 33.4 ml 19.15 ml/m  AORTIC VALVE AV Area (Vmax):    2.23 cm AV Area (Vmean):   2.17 cm AV Area (VTI):     2.17 cm AV Vmax:           159.00 cm/s AV Vmean:          113.000 cm/s AV VTI:            0.329 m AV Peak Grad:      10.1 mmHg AV Mean Grad:      6.0 mmHg LVOT Vmax:         156.00 cm/s LVOT Vmean:        108.000 cm/s LVOT VTI:          0.315 m LVOT/AV VTI ratio: 0.96  AORTA Ao Root diam: 2.90 cm Ao Asc diam:  3.40 cm MITRAL VALVE               TRICUSPID VALVE MV Area (PHT): 2.59 cm    TR Peak grad:   17.8 mmHg MV Decel Time: 293 msec    TR Vmax:        211.00 cm/s MV E velocity: 76.40 cm/s MV A velocity: 68.10 cm/s  SHUNTS MV E/A ratio:  1.12         Systemic VTI:  0.32 m                            Systemic Diam: 1.70 cm Aditya Sabharwal Electronically signed by Dorthula Nettles Signature Date/Time: 07/02/2023/12:54:44 PM    Final    CT Angio Chest/Abd/Pel for Dissection W and/or Wo Contrast  Result Date: 07/02/2023 CLINICAL DATA:  Acute aortic syndrome (AAS) suspected. Pt to ED main entrance with family found unresponsive in car. Echocardiogram showed heart murmur in 2013. Cholecystectomy in 2013. EXAM: CT ANGIOGRAPHY CHEST, ABDOMEN AND PELVIS TECHNIQUE: Non-contrast CT of the chest was initially obtained. Multidetector CT imaging through the chest, abdomen and pelvis was performed using the standard protocol during bolus administration of intravenous contrast. Multiplanar reconstructed images and MIPs were obtained and reviewed to evaluate the vascular anatomy. RADIATION DOSE REDUCTION: This exam was performed according to  the departmental dose-optimization program which includes automated exposure control, adjustment of the mA and/or kV according to patient size and/or use of iterative reconstruction technique. CONTRAST:  OMNIPAQUE IOHEXOL 350 MG/ML SOLN COMPARISON:  CT abdomen pelvis 09/29/20 FINDINGS: CTA CHEST FINDINGS Cardiovascular: Preferential opacification of the thoracic aorta. No evidence of thoracic aortic aneurysm or dissection. Normal heart size. No significant pericardial effusion. The thoracic aorta is normal in caliber. No atherosclerotic plaque of the thoracic aorta. No coronary artery calcifications. The main pulmonary artery is normal in caliber. No central or segmental pulmonary embolus. Limited evaluation more distally due to timing of contrast. Mediastinum/Nodes: No enlarged mediastinal, hilar, or axillary lymph nodes. Thyroid gland, trachea, and esophagus demonstrate no significant findings. Lungs/Pleura: No focal consolidation. No pulmonary nodule. No pulmonary mass. No pleural effusion. No pneumothorax. Musculoskeletal: No  chest wall abnormality. No suspicious lytic or blastic osseous lesions. No acute displaced fracture. Review of the MIP images confirms the above findings. CTA ABDOMEN AND PELVIS FINDINGS VASCULAR Aorta: Normal caliber aorta without aneurysm, dissection, vasculitis or significant stenosis. Celiac: Patent without evidence of aneurysm, dissection, vasculitis or significant stenosis. SMA: Patent without evidence of aneurysm, dissection, vasculitis or significant stenosis. Renals: Both renal arteries are patent without evidence of aneurysm, dissection, vasculitis, fibromuscular dysplasia or significant stenosis. IMA: Patent without evidence of aneurysm, dissection, vasculitis or significant stenosis. Inflow: Patent without evidence of aneurysm, dissection, vasculitis or significant stenosis. Veins: No obvious venous abnormality within the limitations of this arterial phase study. Review of the MIP images confirms the above findings. NON-VASCULAR Hepatobiliary: No focal liver abnormality. Status post cholecystectomy. No biliary dilatation. Pancreas: Unremarkable. No pancreatic ductal dilatation or surrounding inflammatory changes. Spleen: Normal in size without focal abnormality. Adrenals/Urinary Tract: No adrenal nodule bilaterally. Bilateral kidneys enhance symmetrically. No hydronephrosis. No hydroureter. The urinary bladder is unremarkable. Stomach/Bowel: Stomach is within normal limits. No evidence of bowel wall thickening or dilatation. Appendix appears normal. Lymphatic: No lymphadenopathy. Reproductive: Uterus and bilateral adnexa are unremarkable. Other: No intraperitoneal free fluid. No intraperitoneal free gas. No organized fluid collection. Musculoskeletal: No abdominal wall hernia or abnormality. No suspicious lytic or blastic osseous lesions. No acute displaced fracture. Review of the MIP images confirms the above findings. IMPRESSION: 1. No acute vascular abnormality. 2. No acute intrathoracic,  intra-abdominal, intrapelvic abnormality. Electronically Signed   By: Tish Frederickson M.D.   On: 07/02/2023 02:44   CT Head Wo Contrast  Result Date: 07/02/2023 CLINICAL DATA:  Headache, sudden, severe. Patient found unresponsive in car EXAM: CT HEAD WITHOUT CONTRAST TECHNIQUE: Contiguous axial images were obtained from the base of the skull through the vertex without intravenous contrast. RADIATION DOSE REDUCTION: This exam was performed according to the departmental dose-optimization program which includes automated exposure control, adjustment of the mA and/or kV according to patient size and/or use of iterative reconstruction technique. COMPARISON:  CT and MRI head 08/30/2006 FINDINGS: Brain: No intracranial hemorrhage, mass effect, or evidence of acute infarct. No hydrocephalus. No extra-axial fluid collection. Gray-white matter differentiation is preserved. Vascular: No hyperdense vessel or unexpected calcification. Skull: No fracture or focal lesion. Sinuses/Orbits: Mucosal thickening in the paranasal sinuses. No mastoid effusion. Other: None. IMPRESSION: No acute intracranial abnormality. Mucosal thickening in the paranasal sinuses. Electronically Signed   By: Minerva Fester M.D.   On: 07/02/2023 02:36   DG Chest Portable 1 View  Result Date: 07/02/2023 CLINICAL DATA:  Chest pain unresponsive EXAM: PORTABLE CHEST 1 VIEW COMPARISON:  08/30/2010 FINDINGS: The heart size and mediastinal contours are within  normal limits. Both lungs are clear. The visualized skeletal structures are unremarkable. IMPRESSION: No active disease. Electronically Signed   By: Jasmine Pang M.D.   On: 07/02/2023 00:53    Microbiology: No results found for this or any previous visit (from the past 240 hour(s)).   Labs: Basic Metabolic Panel: Recent Labs  Lab 07/02/23 0056  NA 139  K 3.1*  CL 100  CO2 27  GLUCOSE 155*  BUN 15  CREATININE 0.77  CALCIUM 10.1   Liver Function Tests: Recent Labs  Lab  07/02/23 0056  AST 32  ALT 30  ALKPHOS 78  BILITOT 0.5  PROT 8.5*  ALBUMIN 4.9   Recent Labs  Lab 07/02/23 0056  LIPASE 50   No results for input(s): "AMMONIA" in the last 168 hours. CBC: Recent Labs  Lab 07/02/23 0056  WBC 6.2  NEUTROABS 2.4  HGB 13.7  HCT 38.9  MCV 88.4  PLT 249   Cardiac Enzymes: No results for input(s): "CKTOTAL", "CKMB", "CKMBINDEX", "TROPONINI" in the last 168 hours. BNP: BNP (last 3 results) No results for input(s): "BNP" in the last 8760 hours.  ProBNP (last 3 results) No results for input(s): "PROBNP" in the last 8760 hours.  CBG: Recent Labs  Lab 07/02/23 0031  GLUCAP 180*       Signed:  Rhetta Mura MD   [facility] 07/02/2023, 1:37 PM

## 2023-07-10 ENCOUNTER — Other Ambulatory Visit: Payer: Self-pay

## 2023-07-10 ENCOUNTER — Encounter: Payer: Self-pay | Admitting: Family Medicine

## 2023-07-10 ENCOUNTER — Ambulatory Visit: Payer: Medicaid Other | Admitting: Family Medicine

## 2023-07-10 VITALS — BP 110/76 | HR 80 | Wt 167.0 lb

## 2023-07-10 DIAGNOSIS — Z1211 Encounter for screening for malignant neoplasm of colon: Secondary | ICD-10-CM | POA: Diagnosis not present

## 2023-07-10 DIAGNOSIS — N938 Other specified abnormal uterine and vaginal bleeding: Secondary | ICD-10-CM | POA: Insufficient documentation

## 2023-07-10 DIAGNOSIS — R222 Localized swelling, mass and lump, trunk: Secondary | ICD-10-CM | POA: Diagnosis not present
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
## 2023-07-10 NOTE — Assessment & Plan Note (Addendum)
Discussed given only one episode of bleeding I am on the fence about further workup. Further more her untreated and significant hypothyroidism may have contributed. Discussed since she is still having menses that [place] may be difficult to interpret, defer for now. Also discussed EMB but after reviewing risks/benefits deferred for now. Plan to follow up in 3 months, if any further DUB then will do EMB.   Up to date on pap smear Needs mammogram, assisted with scheduling through MyChart app Also needs to reconnect with PCP, shown how to schedule through MyChart app Needs colonoscopy, referral placed

## 2023-07-10 NOTE — Assessment & Plan Note (Signed)
Previously referred to [facility], has not heard. Contacted their office, they will follow up with patient to schedule evaluation.

## 2023-07-10 NOTE — Progress Notes (Signed)
[facility] to follow up on referral. They confirmed receipt of referral and someone will be reaching out to patient to schedule.   Fleet Contras RN

## 2023-08-09 ENCOUNTER — Ambulatory Visit: Payer: Medicaid Other | Admitting: Nurse Practitioner

## 2023-08-09 ENCOUNTER — Encounter: Payer: Self-pay | Admitting: Family Medicine

## 2024-02-18 ENCOUNTER — Emergency Department (HOSPITAL_BASED_OUTPATIENT_CLINIC_OR_DEPARTMENT_OTHER)
Admission: EM | Admit: 2024-02-18 | Discharge: 2024-02-18 | Disposition: A | Discharge status: Home / Self Care | Attending: Emergency Medicine | Admitting: Emergency Medicine

## 2024-02-18 ENCOUNTER — Other Ambulatory Visit: Payer: Self-pay

## 2024-02-18 DIAGNOSIS — T7500XA Unspecified effects of lightning, initial encounter: Secondary | ICD-10-CM | POA: Diagnosis present

## 2024-02-18 LAB — CBC WITH DIFFERENTIAL/PLATELET
Abs Immature Granulocytes: 0 10*3/uL (ref 0.00–0.07)
Basophils Absolute: 0 10*3/uL (ref 0.0–0.1)
Basophils Relative: 1 %
Eosinophils Absolute: 0.3 10*3/uL (ref 0.0–0.5)
Eosinophils Relative: 5 %
HCT: 35.5 % — ABNORMAL LOW (ref 36.0–46.0)
Hemoglobin: 11.7 g/dL — ABNORMAL LOW (ref 12.0–15.0)
Immature Granulocytes: 0 %
Lymphocytes Relative: 37 %
Lymphs Abs: 1.9 10*3/uL (ref 0.7–4.0)
MCH: 28.1 pg (ref 26.0–34.0)
MCHC: 33 g/dL (ref 30.0–36.0)
MCV: 85.3 fL (ref 80.0–100.0)
Monocytes Absolute: 0.3 10*3/uL (ref 0.1–1.0)
Monocytes Relative: 7 %
Neutro Abs: 2.5 10*3/uL (ref 1.7–7.7)
Neutrophils Relative %: 50 %
Platelets: 222 10*3/uL (ref 150–400)
RBC: 4.16 MIL/uL (ref 3.87–5.11)
RDW: 14.1 % (ref 11.5–15.5)
WBC: 5.1 10*3/uL (ref 4.0–10.5)
nRBC: 0 % (ref 0.0–0.2)

## 2024-02-18 LAB — COMPREHENSIVE METABOLIC PANEL WITH GFR
ALT: 19 U/L (ref 0–44)
AST: 27 U/L (ref 15–41)
Albumin: 4.3 g/dL (ref 3.5–5.0)
Alkaline Phosphatase: 79 U/L (ref 38–126)
Anion gap: 13 (ref 5–15)
BUN: 11 mg/dL (ref 6–20)
CO2: 24 mmol/L (ref 22–32)
Calcium: 9.4 mg/dL (ref 8.9–10.3)
Chloride: 103 mmol/L (ref 98–111)
Creatinine, Ser: 0.68 mg/dL (ref 0.44–1.00)
GFR, Estimated: >60 mL/min (ref >60)
Glucose, Bld: 161 mg/dL — ABNORMAL HIGH (ref 70–99)
Potassium: 3.7 mmol/L (ref 3.5–5.1)
Sodium: 139 mmol/L (ref 135–145)
Total Bilirubin: 0.3 mg/dL (ref 0.0–1.2)
Total Protein: 7.2 g/dL (ref 6.5–8.1)

## 2024-02-18 LAB — URINALYSIS, W/ REFLEX TO CULTURE (INFECTION SUSPECTED)
Bacteria, UA: NONE SEEN
Bilirubin Urine: NEGATIVE
Glucose, UA: NEGATIVE mg/dL
Hgb urine dipstick: NEGATIVE
Ketones, ur: NEGATIVE mg/dL
Leukocytes,Ua: NEGATIVE
Nitrite: NEGATIVE
Protein, ur: NEGATIVE mg/dL
Specific Gravity, Urine: <1.005 — ABNORMAL LOW (ref 1.005–1.030)
pH: 6.5 (ref 5.0–8.0)

## 2024-02-18 LAB — TROPONIN T, HIGH SENSITIVITY: Troponin T High Sensitivity: <15 ng/L (ref <19)

## 2024-02-18 LAB — CK: Total CK: 142 U/L (ref 38–234)

## 2024-02-18 MED ORDER — SODIUM CHLORIDE 0.9 % IV BOLUS
1000.0000 mL | Freq: Once | INTRAVENOUS | Status: AC
  Administered 2024-02-18: 1000 mL via INTRAVENOUS
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
## 2024-02-19 ENCOUNTER — Telehealth: Payer: Self-pay

## 2024-02-19 NOTE — Transitions of Care (Post Inpatient/ED Visit) (Signed)
   02/19/2024  Name: Misty Gamble MRN: [id number] DOB: 09-Mar-1976  Today's TOC FU Call Status:   Patient's Name and Date of Birth confirmed.  Transition Care Management Follow-up Telephone Call Date of Discharge: 02/18/24 Discharge Facility: [facility] ([facility]) Type of Discharge: Emergency Department How have you been since you were released from the hospital?: Better Any questions or concerns?: No  Items Reviewed: Did you receive and understand the discharge instructions provided?: Yes Medications obtained,verified, and reconciled?: No Any new allergies since your discharge?: No Dietary orders reviewed?: No Do you have support at home?: Yes People in Home [RPT]: significant other  Medications Reviewed Today: Medications Reviewed Today     Reviewed by Angelita Bares, CMA (Certified Medical Assistant) on 02/19/24 at 1118  Med List Status: <None>   Medication Order Taking? Sig Documenting Provider Last Dose Status Informant  ibuprofen  (ADVIL ) 200 MG tablet [id number] Yes Take 600 mg by mouth 2 (two) times daily as needed for headache or moderate pain (pain score 4-6). [provider]  Active Self, Family Member  levothyroxine  (SYNTHROID ) 100 MCG tablet [id number] Yes Take 1 tablet (100 mcg total) by mouth daily at 6 (six) AM. Samtani, Jai-Gurmukh, MD  Active   Multiple Vitamins-Minerals (MULTIVITAMIN WOMEN) TABS [id number] Yes Take 1 tablet by mouth daily. [provider]  Active Self, Family Member  Specialty Vitamins Products (MENOPAUSE SUPPORT PO) [id number] Yes Take 1 tablet by mouth daily. [provider]  Active Self, Family Member            Home Care and Equipment/Supplies: Were Home Health Services Ordered?: No Any new equipment or medical supplies ordered?: No  Functional Questionnaire: Do you need assistance with bathing/showering or dressing?: No Do you need assistance with meal preparation?: No Do you need assistance  with eating?: No Do you have difficulty maintaining continence: No Do you need assistance with getting out of bed/getting out of a chair/moving?: No Do you have difficulty managing or taking your medications?: No  Follow up appointments reviewed: Specialist Hospital Follow-up appointment confirmed?: NA Do you need transportation to your follow-up appointment?: No Do you understand care options if your condition(s) worsen?: Yes-patient verbalized understanding    SIGNATURE Riti Rollyson, RMA

## 2024-02-29 ENCOUNTER — Inpatient Hospital Stay: Payer: Self-pay | Admitting: Nurse Practitioner
